# Patient Record
Sex: Female | Born: 1955 | Race: Black or African American | Hispanic: No | Smoking: Never smoker
Health system: Southern US, Community
[De-identification: ages and names within clinical notes are randomized; demographics above are authoritative.]

## PROBLEM LIST (undated history)

## (undated) DIAGNOSIS — E118 Type 2 diabetes mellitus with unspecified complications: Secondary | ICD-10-CM

---

## 2021-07-14 ENCOUNTER — Inpatient Hospital Stay (HOSPITAL_COMMUNITY)
Admission: EM | Admit: 2021-07-14 | Discharge: 2021-07-19 | DRG: 638 | Disposition: A | Discharge status: Home / Self Care | Payer: Medicare Other | Attending: Family Medicine | Admitting: Family Medicine

## 2021-07-14 ENCOUNTER — Emergency Department (HOSPITAL_COMMUNITY): Payer: Medicare Other

## 2021-07-14 ENCOUNTER — Other Ambulatory Visit: Payer: Self-pay

## 2021-07-14 ENCOUNTER — Encounter (HOSPITAL_COMMUNITY): Payer: Self-pay | Admitting: *Deleted

## 2021-07-14 DIAGNOSIS — Z91128 Patient's intentional underdosing of medication regimen for other reason: Secondary | ICD-10-CM

## 2021-07-14 DIAGNOSIS — E860 Dehydration: Secondary | ICD-10-CM | POA: Diagnosis present

## 2021-07-14 DIAGNOSIS — D72829 Elevated white blood cell count, unspecified: Secondary | ICD-10-CM | POA: Diagnosis present

## 2021-07-14 DIAGNOSIS — N179 Acute kidney failure, unspecified: Secondary | ICD-10-CM | POA: Diagnosis present

## 2021-07-14 DIAGNOSIS — E1110 Type 2 diabetes mellitus with ketoacidosis without coma: Principal | ICD-10-CM | POA: Diagnosis present

## 2021-07-14 DIAGNOSIS — Z20822 Contact with and (suspected) exposure to covid-19: Secondary | ICD-10-CM | POA: Diagnosis present

## 2021-07-14 DIAGNOSIS — E870 Hyperosmolality and hypernatremia: Secondary | ICD-10-CM | POA: Diagnosis present

## 2021-07-14 DIAGNOSIS — R739 Hyperglycemia, unspecified: Secondary | ICD-10-CM | POA: Diagnosis present

## 2021-07-14 DIAGNOSIS — Z794 Long term (current) use of insulin: Secondary | ICD-10-CM

## 2021-07-14 DIAGNOSIS — I129 Hypertensive chronic kidney disease with stage 1 through stage 4 chronic kidney disease, or unspecified chronic kidney disease: Secondary | ICD-10-CM | POA: Diagnosis present

## 2021-07-14 DIAGNOSIS — N1832 Chronic kidney disease, stage 3b: Secondary | ICD-10-CM | POA: Diagnosis present

## 2021-07-14 DIAGNOSIS — T383X6A Underdosing of insulin and oral hypoglycemic [antidiabetic] drugs, initial encounter: Secondary | ICD-10-CM | POA: Diagnosis present

## 2021-07-14 DIAGNOSIS — Z6841 Body Mass Index (BMI) 40.0 and over, adult: Secondary | ICD-10-CM

## 2021-07-14 DIAGNOSIS — E1165 Type 2 diabetes mellitus with hyperglycemia: Secondary | ICD-10-CM

## 2021-07-14 DIAGNOSIS — E1122 Type 2 diabetes mellitus with diabetic chronic kidney disease: Secondary | ICD-10-CM | POA: Diagnosis present

## 2021-07-14 DIAGNOSIS — E1169 Type 2 diabetes mellitus with other specified complication: Secondary | ICD-10-CM

## 2021-07-14 DIAGNOSIS — E8881 Metabolic syndrome: Secondary | ICD-10-CM | POA: Diagnosis present

## 2021-07-14 DIAGNOSIS — Y929 Unspecified place or not applicable: Secondary | ICD-10-CM

## 2021-07-14 DIAGNOSIS — E1100 Type 2 diabetes mellitus with hyperosmolarity without nonketotic hyperglycemic-hyperosmolar coma (NKHHC): Secondary | ICD-10-CM

## 2021-07-14 DIAGNOSIS — E785 Hyperlipidemia, unspecified: Secondary | ICD-10-CM | POA: Diagnosis present

## 2021-07-14 DIAGNOSIS — T465X6A Underdosing of other antihypertensive drugs, initial encounter: Secondary | ICD-10-CM | POA: Diagnosis present

## 2021-07-14 DIAGNOSIS — R630 Anorexia: Secondary | ICD-10-CM | POA: Diagnosis present

## 2021-07-14 DIAGNOSIS — E875 Hyperkalemia: Secondary | ICD-10-CM | POA: Diagnosis present

## 2021-07-14 DIAGNOSIS — Z79899 Other long term (current) drug therapy: Secondary | ICD-10-CM

## 2021-07-14 HISTORY — DX: Type 2 diabetes mellitus with unspecified complications: E11.8

## 2021-07-14 LAB — CBG MONITORING, ED
Glucose-Capillary: >600 mg/dL (ref 70–99)
Glucose-Capillary: >600 mg/dL (ref 70–99)
Glucose-Capillary: >600 mg/dL (ref 70–99)
Glucose-Capillary: >600 mg/dL (ref 70–99)

## 2021-07-14 LAB — BASIC METABOLIC PANEL
Anion gap: 26 — ABNORMAL HIGH (ref 5–15)
BUN: 46 mg/dL — ABNORMAL HIGH (ref 8–23)
CO2: 18 mmol/L — ABNORMAL LOW (ref 22–32)
Calcium: 10.7 mg/dL — ABNORMAL HIGH (ref 8.9–10.3)
Chloride: 101 mmol/L (ref 98–111)
Creatinine, Ser: 2.43 mg/dL — ABNORMAL HIGH (ref 0.44–1.00)
GFR, Estimated: 21 mL/min — ABNORMAL LOW (ref >60)
Glucose, Bld: 916 mg/dL (ref 70–99)
Potassium: 7 mmol/L (ref 3.5–5.1)
Sodium: 145 mmol/L (ref 135–145)

## 2021-07-14 LAB — CBC WITH DIFFERENTIAL/PLATELET
Abs Immature Granulocytes: 0.04 10*3/uL (ref 0.00–0.07)
Basophils Absolute: 0.1 10*3/uL (ref 0.0–0.1)
Basophils Relative: 0 %
Eosinophils Absolute: 0 10*3/uL (ref 0.0–0.5)
Eosinophils Relative: 0 %
HCT: 51.7 % — ABNORMAL HIGH (ref 36.0–46.0)
Hemoglobin: 14.6 g/dL (ref 12.0–15.0)
Immature Granulocytes: 0 %
Lymphocytes Relative: 9 %
Lymphs Abs: 1 10*3/uL (ref 0.7–4.0)
MCH: 26 pg (ref 26.0–34.0)
MCHC: 28.2 g/dL — ABNORMAL LOW (ref 30.0–36.0)
MCV: 92.2 fL (ref 80.0–100.0)
Monocytes Absolute: 0.6 10*3/uL (ref 0.1–1.0)
Monocytes Relative: 6 %
Neutro Abs: 9.5 10*3/uL — ABNORMAL HIGH (ref 1.7–7.7)
Neutrophils Relative %: 85 %
Platelets: 254 10*3/uL (ref 150–400)
RBC: 5.61 MIL/uL — ABNORMAL HIGH (ref 3.87–5.11)
RDW: 15.4 % (ref 11.5–15.5)
WBC: 11.2 10*3/uL — ABNORMAL HIGH (ref 4.0–10.5)
nRBC: 0 % (ref 0.0–0.2)

## 2021-07-14 MED ORDER — SODIUM CHLORIDE 0.9 % IV BOLUS: 1000.0000 mL | Freq: Once | INTRAVENOUS | Status: DC

## 2021-07-14 MED ORDER — LACTATED RINGERS IV SOLN: INTRAVENOUS | Status: DC

## 2021-07-14 MED ORDER — DEXTROSE 50 % IV SOLN: 0.0000 mL | INTRAVENOUS | Status: DC | PRN

## 2021-07-14 MED ORDER — CALCIUM GLUCONATE-NACL 1-0.675 GM/50ML-% IV SOLN
1.0000 g | Freq: Once | INTRAVENOUS | Status: AC
  Administered 2021-07-14: 1000 mg via INTRAVENOUS
  Filled 2021-07-14: qty 50

## 2021-07-14 MED ORDER — DEXTROSE IN LACTATED RINGERS 5 % IV SOLN: INTRAVENOUS | Status: DC

## 2021-07-14 MED ORDER — INSULIN REGULAR(HUMAN) IN NACL 100-0.9 UT/100ML-% IV SOLN
INTRAVENOUS | Status: DC
Start: 2021-07-14 — End: 2021-07-15
  Administered 2021-07-14: 11.5 [IU]/h via INTRAVENOUS
  Administered 2021-07-15: 12 [IU]/h via INTRAVENOUS
  Filled 2021-07-14 (×2): qty 100

## 2021-07-14 NOTE — ED Triage Notes (Signed)
The pt arrived  bt gems from   a relatives home  she is here visiting here form [place]  shes a diabetic that has not taken her meds for 4 days.  No reason why  she reports that she takes her meds with her when she travels  she does not know when she will return to [place] [address]

## 2021-07-14 NOTE — ED Notes (Signed)
Glucose >600, endotool instructs rate 11.5

## 2021-07-14 NOTE — ED Provider Notes (Signed)
[facility] EMERGENCY DEPARTMENT Provider Note   CSN: [id number] Arrival date & time: 07/14/21  2025     History Chief Complaint  Patient presents with   Hyperglycemia    Autumn James is a 65 y.o. female.  HPI Level 5 caveat due to altered mental status  Patient is able to provide some history but primarily history is provided by son.  He states that patient arrived from [place] where she lives to visit her son.  States that she has not taken her diabetes medications in 2 days--Per triage provider it has been 4 days--she does use insulin 30U Lantus daily.  On my reassessment 12:37 AM patient is more alert and able to answer questions  She states that she stopped taking her insulin 4 days ago.  She states that she does not know why she stopped taking her insulin.  On further questioning she states she does not like being poked with needles. She denies any chest pain or shortness of breath lightheadedness or dizziness nausea or vomiting.  She states she feels very fatigued and tired and cloudy and drained.  She has been peeing frequently over the past 3 to 4 days.  She denies any cough congestion fevers or chills.  No headaches no falls or injuries.  No other associate symptoms.     Past Medical History:  Diagnosis Date   Diabetic complication (HCC)     There are no problems to display for this patient.   History reviewed. No pertinent surgical history.   OB History   No obstetric history on file.     No family history on file.  Social History   Tobacco Use   Smoking status: Never    Passive exposure: Never   Smokeless tobacco: Never  Substance Use Topics   Alcohol use: Never   Drug use: Never    Home Medications Prior to Admission medications   Not on File    Allergies    Ivp dye [iodinated diagnostic agents]  Review of Systems   Review of Systems  Constitutional:  Positive for fatigue. Negative for chills and fever.  HENT:   Negative for congestion.   Eyes:  Negative for pain.  Respiratory:  Negative for cough and shortness of breath.   Cardiovascular:  Negative for chest pain and leg swelling.  Gastrointestinal:  Negative for abdominal pain and vomiting.  Genitourinary:  Positive for frequency. Negative for dysuria.  Musculoskeletal:  Negative for myalgias.  Skin:  Negative for rash.  Neurological:  Negative for dizziness and headaches.   Physical Exam Updated Vital Signs BP (!) 146/97   Pulse (!) 110   Temp 97.9 F (36.6 C) (Oral)   Resp 13   Ht 5\' 5"  (1.651 m)   Wt 116.1 kg   SpO2 95%   BMI 42.60 kg/m   Physical Exam Vitals and nursing note reviewed.  Constitutional:      General: She is not in acute distress.    Appearance: She is obese.  HENT:     Head: Normocephalic and atraumatic.     Nose: Nose normal.     Mouth/Throat:     Mouth: Mucous membranes are dry.  Eyes:     General: No scleral icterus. Cardiovascular:     Rate and Rhythm: Regular rhythm. Tachycardia present.     Pulses: Normal pulses.     Heart sounds: Normal heart sounds.  Pulmonary:     Effort: Pulmonary effort is normal. No respiratory distress.  Breath sounds: No wheezing.  Abdominal:     Palpations: Abdomen is soft.     Tenderness: There is no abdominal tenderness. There is no guarding or rebound.  Musculoskeletal:     Cervical back: Normal range of motion.     Right lower leg: No edema.     Left lower leg: No edema.     Comments: No lower extremity edema  Skin:    General: Skin is warm and dry.     Capillary Refill: Capillary refill takes less than 2 seconds.  Neurological:     Mental Status: She is alert.     Comments: Oriented x2, not certain of where she is currently located  Moves all 4 extremities has sensation all 4 extremities smile symmetric, EOMI  Psychiatric:     Comments: Flat affect    ED Results / Procedures / Treatments   Labs (all labs ordered are listed, but only abnormal results  are displayed) Labs Reviewed  BASIC METABOLIC PANEL - Abnormal; Notable for the following components:      Result Value   Potassium 7.0 (*)    CO2 18 (*)    Glucose, Bld 916 (*)    BUN 46 (*)    Creatinine, Ser 2.43 (*)    Calcium 10.7 (*)    GFR, Estimated 21 (*)    Anion gap 26 (*)    All other components within normal limits  CBC WITH DIFFERENTIAL/PLATELET - Abnormal; Notable for the following components:   WBC 11.2 (*)    RBC 5.61 (*)    HCT 51.7 (*)    MCHC 28.2 (*)    Neutro Abs 9.5 (*)    All other components within normal limits  CBG MONITORING, ED - Abnormal; Notable for the following components:   Glucose-Capillary >600 (*)    All other components within normal limits  CBG MONITORING, ED - Abnormal; Notable for the following components:   Glucose-Capillary >600 (*)    All other components within normal limits  CBG MONITORING, ED - Abnormal; Notable for the following components:   Glucose-Capillary >600 (*)    All other components within normal limits  CBG MONITORING, ED - Abnormal; Notable for the following components:   Glucose-Capillary >600 (*)    All other components within normal limits  RESP PANEL BY RT-PCR (FLU A&B, COVID) ARPGX2  URINALYSIS, ROUTINE W REFLEX MICROSCOPIC  BETA-HYDROXYBUTYRIC ACID  BASIC METABOLIC PANEL  BASIC METABOLIC PANEL  BASIC METABOLIC PANEL  BASIC METABOLIC PANEL  BETA-HYDROXYBUTYRIC ACID  BASIC METABOLIC PANEL  BETA-HYDROXYBUTYRIC ACID  HEPATIC FUNCTION PANEL  I-STAT VENOUS BLOOD GAS, ED    EKG None  Radiology DG Chest Portable 1 View  Result Date: 07/15/2021 CLINICAL DATA:  Shortness of breath EXAM: PORTABLE CHEST 1 VIEW COMPARISON:  None. FINDINGS: Heart and mediastinal contours are within normal limits. No focal opacities or effusions. No acute bony abnormality. IMPRESSION: No active disease. Electronically Signed   By: Charlett Nose M.D.   On: 07/15/2021 00:15    Procedures Ultrasound ED Peripheral IV  (Provider)  Date/Time: 07/14/2021 10:56 PM Performed by: Gailen Shelter, PA Authorized by: Gailen Shelter, PA   Procedure details:    Indications: multiple failed IV attempts     Skin Prep: chlorhexidine gluconate     Location:  Right AC   Angiocath:  20 G   Bedside Ultrasound Guided: Yes     Images: not archived     Patient tolerated procedure without complications: Yes  Dressing applied: Yes   .Critical Care Performed by: Gailen Shelter, PA Authorized by: Gailen Shelter, PA   Critical care provider statement:    Critical care time (minutes):  35   Critical care time was exclusive of:  Separately billable procedures and treating other patients and teaching time   Critical care was necessary to treat or prevent imminent or life-threatening deterioration of the following conditions:  Metabolic crisis   Critical care was time spent personally by me on the following activities:  Discussions with consultants, evaluation of patient's response to treatment, examination of patient, review of old charts, re-evaluation of patient's condition, pulse oximetry, ordering and review of radiographic studies, ordering and review of laboratory studies and ordering and performing treatments and interventions   I assumed direction of critical care for this patient from another provider in my specialty: no     Medications Ordered in ED Medications  insulin regular, human (MYXREDLIN) 100 units/ 100 mL infusion (11.5 Units/hr Intravenous New Bag/Given 07/14/21 2246)  lactated ringers infusion ( Intravenous New Bag/Given 07/14/21 2239)  dextrose 5 % in lactated ringers infusion (0 mLs Intravenous Hold 07/14/21 2204)  dextrose 50 % solution 0-50 mL (has no administration in time range)  sodium chloride 0.9 % bolus 1,000 mL (has no administration in time range)  albuterol (VENTOLIN HFA) 108 (90 Base) MCG/ACT inhaler 2 puff (has no administration in time range)  calcium gluconate 1 g/ 50 mL sodium  chloride IVPB (0 mg Intravenous Stopped 07/14/21 2354)    ED Course  I have reviewed the triage vital signs and the nursing notes.  Pertinent labs & imaging results that were available during my care of the patient were reviewed by me and considered in my medical decision making (see chart for details).  Clinical Course as of 07/15/21 0037  Mon Jul 15, 2021  0032 Discussed with Dr. Laroy Apple of family medicine resident service who will evaluate pt.  Dr Pilar Plate aware of pt in case of need for further resuscitation. She is well appearing .Mentating well at this time.  [WF]  0033 Potassium(!!): 7.0 Treated with albuterol, insulin, calcium gluconate no EKG changes. [WF]  0033 Glucose(!!): 916 Hyperglycemia with anion gap of 26 consistent with DKA VBG and beta hydroxybutyrate ordered [WF]  0033 CBC with Differential (PNL)(!) Mild leukocytosis but also erythrocytosis consistent with dehydration [WF]  0033 She is afebrile doubt sepsis. [WF]    Clinical Course User Index [WF] Gailen Shelter, [place]   MDM Rules/Calculators/A&P                           Patient is a 65 year old female with past medical history significant for DM2 takes Lantus nightly 30 units however stopped taking this 4 days ago she is informing me after her mental status is improved that she stopped taking her insulin because she was tired of needles.  She denies any pain anywhere currently states that she is breathing well denies any chest pain slurred speech confusion but does endorse severe weakness and fatigue with polyuria  She is neurologically intact on my examination nontender abdomen appears dehydrated  Does have a potassium of greater than 7 Provided patient with calcium gluconate, insulin and albuterol 2 puffs  EKG without any changes of hyperkalemia.  BMP will be repeated to confirm hyperkalemia.  She will require admission.  Discussed with Dr. Pilar Plate as well as family medicine teaching service.  Teaching service  will evaluate and  consider admission and Dr. Pilar Plate aware of pt in case of additional questions/needs.   Final Clinical Impression(s) / ED Diagnoses Final diagnoses:  Diabetic ketoacidosis without coma associated with type 2 diabetes mellitus [facility])    Rx / DC Orders ED Discharge Orders     None        Gailen Shelter, [place] 07/15/21 0038    Benjiman Core, MD 07/17/21 [phone]

## 2021-07-14 NOTE — ED Notes (Signed)
Lab called glucose 916  pot 7.0  chg notified working on bed

## 2021-07-14 NOTE — ED Notes (Signed)
Endotool instructs insulin rate 11.5, glucose recheck 2314

## 2021-07-14 NOTE — ED Provider Notes (Signed)
Emergency Medicine Provider Triage Evaluation Note  Autumn James , a 65 y.o. female  was evaluated in triage.  Pt brought in by EMS for hyperglycemia.  Patient is visiting from [place], states that she has not taken her diabetes medication for 4 days, unknown reasoning.  She is not sure if she has type I or type 2 diabetes.  States she is on insulin.  CBG reads high.  Denies pain or other symptoms.  Review of Systems  Positive: Lethargy Negative: Nausea, vomiting, diarrhea, fevers  Physical Exam  BP (!) 159/96 (BP Location: Left Arm)   Pulse 98   Temp 97.9 F (36.6 C) (Oral)   Resp 15   Ht 5\' 5"  (1.651 m)   Wt 116.1 kg   SpO2 95%   BMI 42.60 kg/m  Gen:   Lethargic, no distress   Resp:  Normal effort  MSK:   Moves extremities without difficulty  Other:  No Kussmaul breathing, capnography normal with EMS  Medical Decision Making  Medically screening exam initiated at 8:42 PM.  Appropriate orders placed.  Autumn James was informed that the remainder of the evaluation will be completed by another provider, this initial triage assessment does not replace that evaluation, and the importance of remaining in the ED until their evaluation is complete.  Hyperglycemia protocol initiated   Autumn James 07/14/21 2046    2047, MD 07/22/21 1322

## 2021-07-15 DIAGNOSIS — R739 Hyperglycemia, unspecified: Secondary | ICD-10-CM

## 2021-07-15 DIAGNOSIS — T383X6A Underdosing of insulin and oral hypoglycemic [antidiabetic] drugs, initial encounter: Secondary | ICD-10-CM | POA: Diagnosis present

## 2021-07-15 DIAGNOSIS — Z79899 Other long term (current) drug therapy: Secondary | ICD-10-CM | POA: Diagnosis not present

## 2021-07-15 DIAGNOSIS — E1110 Type 2 diabetes mellitus with ketoacidosis without coma: Secondary | ICD-10-CM | POA: Insufficient documentation

## 2021-07-15 DIAGNOSIS — E8881 Metabolic syndrome: Secondary | ICD-10-CM | POA: Diagnosis present

## 2021-07-15 DIAGNOSIS — E1165 Type 2 diabetes mellitus with hyperglycemia: Secondary | ICD-10-CM

## 2021-07-15 DIAGNOSIS — T465X6A Underdosing of other antihypertensive drugs, initial encounter: Secondary | ICD-10-CM | POA: Diagnosis present

## 2021-07-15 DIAGNOSIS — Z6841 Body Mass Index (BMI) 40.0 and over, adult: Secondary | ICD-10-CM | POA: Diagnosis not present

## 2021-07-15 DIAGNOSIS — I129 Hypertensive chronic kidney disease with stage 1 through stage 4 chronic kidney disease, or unspecified chronic kidney disease: Secondary | ICD-10-CM | POA: Diagnosis present

## 2021-07-15 DIAGNOSIS — Y929 Unspecified place or not applicable: Secondary | ICD-10-CM | POA: Diagnosis not present

## 2021-07-15 DIAGNOSIS — E1169 Type 2 diabetes mellitus with other specified complication: Secondary | ICD-10-CM | POA: Diagnosis not present

## 2021-07-15 DIAGNOSIS — Z91128 Patient's intentional underdosing of medication regimen for other reason: Secondary | ICD-10-CM | POA: Diagnosis not present

## 2021-07-15 DIAGNOSIS — E1122 Type 2 diabetes mellitus with diabetic chronic kidney disease: Secondary | ICD-10-CM | POA: Diagnosis present

## 2021-07-15 DIAGNOSIS — E870 Hyperosmolality and hypernatremia: Secondary | ICD-10-CM | POA: Diagnosis present

## 2021-07-15 DIAGNOSIS — E1100 Type 2 diabetes mellitus with hyperosmolarity without nonketotic hyperglycemic-hyperosmolar coma (NKHHC): Secondary | ICD-10-CM | POA: Diagnosis not present

## 2021-07-15 DIAGNOSIS — R630 Anorexia: Secondary | ICD-10-CM | POA: Diagnosis present

## 2021-07-15 DIAGNOSIS — N179 Acute kidney failure, unspecified: Secondary | ICD-10-CM | POA: Diagnosis present

## 2021-07-15 DIAGNOSIS — E875 Hyperkalemia: Secondary | ICD-10-CM | POA: Diagnosis present

## 2021-07-15 DIAGNOSIS — N1832 Chronic kidney disease, stage 3b: Secondary | ICD-10-CM | POA: Diagnosis present

## 2021-07-15 DIAGNOSIS — Z20822 Contact with and (suspected) exposure to covid-19: Secondary | ICD-10-CM | POA: Diagnosis present

## 2021-07-15 DIAGNOSIS — D72829 Elevated white blood cell count, unspecified: Secondary | ICD-10-CM | POA: Diagnosis present

## 2021-07-15 DIAGNOSIS — E860 Dehydration: Secondary | ICD-10-CM | POA: Diagnosis present

## 2021-07-15 DIAGNOSIS — Z794 Long term (current) use of insulin: Secondary | ICD-10-CM | POA: Diagnosis not present

## 2021-07-15 DIAGNOSIS — E785 Hyperlipidemia, unspecified: Secondary | ICD-10-CM | POA: Diagnosis present

## 2021-07-15 LAB — GLUCOSE, CAPILLARY
Glucose-Capillary: 209 mg/dL — ABNORMAL HIGH (ref 70–99)
Glucose-Capillary: 283 mg/dL — ABNORMAL HIGH (ref 70–99)
Glucose-Capillary: 288 mg/dL — ABNORMAL HIGH (ref 70–99)
Glucose-Capillary: 361 mg/dL — ABNORMAL HIGH (ref 70–99)

## 2021-07-15 LAB — BASIC METABOLIC PANEL
Anion gap: 15 (ref 5–15)
Anion gap: 13 (ref 5–15)
Anion gap: 11 (ref 5–15)
Anion gap: 10 (ref 5–15)
Anion gap: 14 (ref 5–15)
Anion gap: 13 (ref 5–15)
BUN: 49 mg/dL — ABNORMAL HIGH (ref 8–23)
BUN: 50 mg/dL — ABNORMAL HIGH (ref 8–23)
BUN: 50 mg/dL — ABNORMAL HIGH (ref 8–23)
BUN: 47 mg/dL — ABNORMAL HIGH (ref 8–23)
BUN: 50 mg/dL — ABNORMAL HIGH (ref 8–23)
BUN: 49 mg/dL — ABNORMAL HIGH (ref 8–23)
CO2: 23 mmol/L (ref 22–32)
CO2: 28 mmol/L (ref 22–32)
CO2: 27 mmol/L (ref 22–32)
CO2: 28 mmol/L (ref 22–32)
CO2: 23 mmol/L (ref 22–32)
CO2: 23 mmol/L (ref 22–32)
Calcium: 10.6 mg/dL — ABNORMAL HIGH (ref 8.9–10.3)
Calcium: 10.7 mg/dL — ABNORMAL HIGH (ref 8.9–10.3)
Calcium: 10.3 mg/dL (ref 8.9–10.3)
Calcium: 10.2 mg/dL (ref 8.9–10.3)
Calcium: 9.9 mg/dL (ref 8.9–10.3)
Calcium: 10.2 mg/dL (ref 8.9–10.3)
Chloride: 111 mmol/L (ref 98–111)
Chloride: 113 mmol/L — ABNORMAL HIGH (ref 98–111)
Chloride: 115 mmol/L — ABNORMAL HIGH (ref 98–111)
Chloride: 113 mmol/L — ABNORMAL HIGH (ref 98–111)
Chloride: 113 mmol/L — ABNORMAL HIGH (ref 98–111)
Chloride: 113 mmol/L — ABNORMAL HIGH (ref 98–111)
Creatinine, Ser: 2.56 mg/dL — ABNORMAL HIGH (ref 0.44–1.00)
Creatinine, Ser: 2.56 mg/dL — ABNORMAL HIGH (ref 0.44–1.00)
Creatinine, Ser: 2.35 mg/dL — ABNORMAL HIGH (ref 0.44–1.00)
Creatinine, Ser: 2.38 mg/dL — ABNORMAL HIGH (ref 0.44–1.00)
Creatinine, Ser: 2.3 mg/dL — ABNORMAL HIGH (ref 0.44–1.00)
Creatinine, Ser: 2.22 mg/dL — ABNORMAL HIGH (ref 0.44–1.00)
GFR, Estimated: 20 mL/min — ABNORMAL LOW (ref >60)
GFR, Estimated: 20 mL/min — ABNORMAL LOW (ref >60)
GFR, Estimated: 22 mL/min — ABNORMAL LOW (ref >60)
GFR, Estimated: 22 mL/min — ABNORMAL LOW (ref >60)
GFR, Estimated: 23 mL/min — ABNORMAL LOW (ref >60)
GFR, Estimated: 24 mL/min — ABNORMAL LOW (ref >60)
Glucose, Bld: 573 mg/dL (ref 70–99)
Glucose, Bld: 389 mg/dL — ABNORMAL HIGH (ref 70–99)
Glucose, Bld: 231 mg/dL — ABNORMAL HIGH (ref 70–99)
Glucose, Bld: 214 mg/dL — ABNORMAL HIGH (ref 70–99)
Glucose, Bld: 272 mg/dL — ABNORMAL HIGH (ref 70–99)
Glucose, Bld: 294 mg/dL — ABNORMAL HIGH (ref 70–99)
Potassium: 4 mmol/L (ref 3.5–5.1)
Potassium: 3.9 mmol/L (ref 3.5–5.1)
Potassium: 3.7 mmol/L (ref 3.5–5.1)
Potassium: 4.2 mmol/L (ref 3.5–5.1)
Potassium: 4.2 mmol/L (ref 3.5–5.1)
Potassium: 4.4 mmol/L (ref 3.5–5.1)
Sodium: 149 mmol/L — ABNORMAL HIGH (ref 135–145)
Sodium: 154 mmol/L — ABNORMAL HIGH (ref 135–145)
Sodium: 153 mmol/L — ABNORMAL HIGH (ref 135–145)
Sodium: 151 mmol/L — ABNORMAL HIGH (ref 135–145)
Sodium: 150 mmol/L — ABNORMAL HIGH (ref 135–145)
Sodium: 149 mmol/L — ABNORMAL HIGH (ref 135–145)

## 2021-07-15 LAB — I-STAT VENOUS BLOOD GAS, ED
Acid-base deficit: 4 mmol/L — ABNORMAL HIGH (ref 0.0–2.0)
Bicarbonate: 18.8 mmol/L — ABNORMAL LOW (ref 20.0–28.0)
Calcium, Ion: 1.27 mmol/L (ref 1.15–1.40)
HCT: 44 % (ref 36.0–46.0)
Hemoglobin: 15 g/dL (ref 12.0–15.0)
O2 Saturation: 97 %
Potassium: 4.5 mmol/L (ref 3.5–5.1)
Sodium: 148 mmol/L — ABNORMAL HIGH (ref 135–145)
TCO2: 20 mmol/L — ABNORMAL LOW (ref 22–32)
pCO2, Ven: 29.3 mmHg — ABNORMAL LOW (ref 44.0–60.0)
pH, Ven: 7.414 (ref 7.250–7.430)
pO2, Ven: 90 mmHg — ABNORMAL HIGH (ref 32.0–45.0)

## 2021-07-15 LAB — URINALYSIS, ROUTINE W REFLEX MICROSCOPIC
Bacteria, UA: NONE SEEN
Bilirubin Urine: NEGATIVE
Glucose, UA: >=500 mg/dL — AB
Hgb urine dipstick: NEGATIVE
Ketones, ur: 20 mg/dL — AB
Nitrite: NEGATIVE
Protein, ur: 30 mg/dL — AB
Specific Gravity, Urine: 1.025 (ref 1.005–1.030)
pH: 5 (ref 5.0–8.0)

## 2021-07-15 LAB — HEPATIC FUNCTION PANEL
ALT: 14 U/L (ref 0–44)
AST: 12 U/L — ABNORMAL LOW (ref 15–41)
Albumin: 3.4 g/dL — ABNORMAL LOW (ref 3.5–5.0)
Alkaline Phosphatase: 79 U/L (ref 38–126)
Bilirubin, Direct: <0.1 mg/dL (ref 0.0–0.2)
Total Bilirubin: 1.1 mg/dL (ref 0.3–1.2)
Total Protein: 7.7 g/dL (ref 6.5–8.1)

## 2021-07-15 LAB — CBG MONITORING, ED
Glucose-Capillary: 187 mg/dL — ABNORMAL HIGH (ref 70–99)
Glucose-Capillary: 196 mg/dL — ABNORMAL HIGH (ref 70–99)
Glucose-Capillary: 198 mg/dL — ABNORMAL HIGH (ref 70–99)
Glucose-Capillary: 203 mg/dL — ABNORMAL HIGH (ref 70–99)
Glucose-Capillary: 205 mg/dL — ABNORMAL HIGH (ref 70–99)
Glucose-Capillary: 223 mg/dL — ABNORMAL HIGH (ref 70–99)
Glucose-Capillary: 268 mg/dL — ABNORMAL HIGH (ref 70–99)
Glucose-Capillary: 277 mg/dL — ABNORMAL HIGH (ref 70–99)
Glucose-Capillary: 337 mg/dL — ABNORMAL HIGH (ref 70–99)
Glucose-Capillary: 393 mg/dL — ABNORMAL HIGH (ref 70–99)
Glucose-Capillary: 417 mg/dL — ABNORMAL HIGH (ref 70–99)
Glucose-Capillary: 471 mg/dL — ABNORMAL HIGH (ref 70–99)
Glucose-Capillary: 514 mg/dL (ref 70–99)
Glucose-Capillary: >600 mg/dL (ref 70–99)
Glucose-Capillary: >600 mg/dL (ref 70–99)

## 2021-07-15 LAB — RESP PANEL BY RT-PCR (FLU A&B, COVID) ARPGX2
Influenza A by PCR: NEGATIVE
Influenza B by PCR: NEGATIVE
SARS Coronavirus 2 by RT PCR: NEGATIVE

## 2021-07-15 LAB — BETA-HYDROXYBUTYRIC ACID
Beta-Hydroxybutyric Acid: >8 mmol/L — ABNORMAL HIGH (ref 0.05–0.27)
Beta-Hydroxybutyric Acid: 0.94 mmol/L — ABNORMAL HIGH (ref 0.05–0.27)

## 2021-07-15 LAB — HIV ANTIBODY (ROUTINE TESTING W REFLEX): HIV Screen 4th Generation wRfx: NONREACTIVE

## 2021-07-15 LAB — HEMOGLOBIN A1C
Hgb A1c MFr Bld: 14.3 % — ABNORMAL HIGH (ref 4.8–5.6)
Mean Plasma Glucose: 363.71 mg/dL

## 2021-07-15 LAB — MRSA NEXT GEN BY PCR, NASAL: MRSA by PCR Next Gen: NOT DETECTED

## 2021-07-15 MED ORDER — SODIUM CHLORIDE 0.45 % IV SOLN: INTRAVENOUS | Status: DC

## 2021-07-15 MED ORDER — DEXTROSE-NACL 5-0.45 % IV SOLN: INTRAVENOUS | Status: DC

## 2021-07-15 MED ORDER — INSULIN GLARGINE-YFGN 100 UNIT/ML ~~LOC~~ SOLN
30.0000 [IU] | Freq: Every day | SUBCUTANEOUS | Status: DC
  Administered 2021-07-15: 30 [IU] via SUBCUTANEOUS
  Filled 2021-07-15: qty 0.3

## 2021-07-15 MED ORDER — LACTATED RINGERS IV BOLUS: 1000.0000 mL | Freq: Once | INTRAVENOUS | Status: DC

## 2021-07-15 MED ORDER — ALBUTEROL SULFATE HFA 108 (90 BASE) MCG/ACT IN AERS
2.0000 | INHALATION_SPRAY | Freq: Once | RESPIRATORY_TRACT | Status: AC
  Administered 2021-07-15: 2 via RESPIRATORY_TRACT
  Filled 2021-07-15: qty 6.7

## 2021-07-15 MED ORDER — INSULIN REGULAR(HUMAN) IN NACL 100-0.9 UT/100ML-% IV SOLN
INTRAVENOUS | Status: DC
  Administered 2021-07-15: 11.5 [IU]/h via INTRAVENOUS
  Filled 2021-07-15: qty 100

## 2021-07-15 MED ORDER — DEXTROSE 50 % IV SOLN: 0.0000 mL | INTRAVENOUS | Status: DC | PRN

## 2021-07-15 MED ORDER — ENOXAPARIN SODIUM 30 MG/0.3ML IJ SOSY
30.0000 mg | PREFILLED_SYRINGE | INTRAMUSCULAR | Status: DC
  Administered 2021-07-15 – 2021-07-16 (×2): 30 mg via SUBCUTANEOUS
  Filled 2021-07-15 (×2): qty 0.3

## 2021-07-15 MED ORDER — POTASSIUM CHLORIDE CRYS ER 20 MEQ PO TBCR
40.0000 meq | EXTENDED_RELEASE_TABLET | Freq: Once | ORAL | Status: AC
  Administered 2021-07-15: 40 meq via ORAL
  Filled 2021-07-15: qty 2

## 2021-07-15 MED ORDER — SODIUM CHLORIDE 0.9 % IV BOLUS
1000.0000 mL | Freq: Once | INTRAVENOUS | Status: AC
  Administered 2021-07-15: 1000 mL via INTRAVENOUS

## 2021-07-15 MED ORDER — INSULIN ASPART 100 UNIT/ML IJ SOLN
0.0000 [IU] | Freq: Three times a day (TID) | INTRAMUSCULAR | Status: DC
  Administered 2021-07-15: 5 [IU] via SUBCUTANEOUS

## 2021-07-15 MED ORDER — POTASSIUM CHLORIDE 10 MEQ/100ML IV SOLN
10.0000 meq | INTRAVENOUS | Status: AC
Start: 2021-07-15 — End: 2021-07-16
  Administered 2021-07-15 (×2): 10 meq via INTRAVENOUS
  Filled 2021-07-15 (×2): qty 100

## 2021-07-15 MED ORDER — SODIUM CHLORIDE 0.45 % IV SOLN: INTRAVENOUS | Status: AC

## 2021-07-15 NOTE — Progress Notes (Signed)
Inpatient Diabetes Program Recommendations  AACE/ADA: New Consensus Statement on Inpatient Glycemic Control (2015)  Target Ranges:  Prepandial:   less than 140 mg/dL      Peak postprandial:   less than 180 mg/dL (1-2 hours)      Critically ill patients:  140 - 180 mg/dL   Lab Results  Component Value Date   GLUCAP 196 (H) 07/15/2021   HGBA1C 14.3 (H) 07/15/2021    Review of Glycemic Control Results for LAQUIDA, COTRELL (MRN [id number]) as of 07/15/2021 10:21  Ref. Range 07/15/2021 07:59 07/15/2021 09:05 07/15/2021 10:02  Glucose-Capillary Latest Ref Range: 70 - 99 mg/dL 106 (H) 269 (H) 485 (H)    Outpatient Diabetes medications: Lantus 30 units QD Current orders for Inpatient glycemic control: IV insulin  Inpatient Diabetes Program Recommendations:    When ready to transition consider: -Semglee 18 units two hours prior to discontinuation, then QD to follow -Novolog 0-9 units TID & HS  Thanks, Lujean Rave, MSN, RNC-OB Diabetes Coordinator [phone] (8a-5p)

## 2021-07-15 NOTE — Hospital Course (Addendum)
Hyperglycemia in the context of T2DM, likely DKA Patient reportedly traveled from [place] to visit her son in [place]. Patient reports not taking her insulin in 4 days.  Home regimen of 30 units of Lantus daily. A1C of 14.3. On admission patient somnolent with orthostasis and polyuria.  Lab values notable for glc 916, anion gap of 26 (normal pH), beta hydroxybutyrate greater than 8, UA with 20 ketones. No signs of infection aside from mild leukocytosis to 11.2 (CXR negative, no fever). She was started on an insulin drip. Her anion gap closed and this was discontinued. Patient had limited appetite, only consuming 25-50% of meals, but was found to be very insulin resistant. Patient reports that she cannot inject herself and is not willing to learn how. We discussed this with her family with her permission and her granddaughter is in nursing school and can giver her once daily injections (she is not available for prandial injections). Patient was found to have a daily insulin requirement of about 100U with consuming a 25-50% of a regular diet. She was discharged on 70 units of glargine daily, but may require more. Plan for discharge was discussed extensively with family. As patient is from out of state, we were unable to set up home health. We recommend she follow up with her PCP ASAP to further titrate insulin needs and to obtain home health nursing to administer more insulin.    AKI- resolved Patient is a poor historian and is unsure if she has pre-existing renal disease. Per care wheverywhere, last Cr in 2019 of 1.05. Creatinine of 2.43 in the ED with a BUN of 46.  Creatinine of 2.56 on 9/26 with BUN of 50. For this AKI, she was given mIVF and her home losartan was held. Her creatinine improved to 1.11 on the day of discharge, AKI resolved. Her home Losartan was started back at 25 mg for renal protection.   Hypertension Patient had BP to the 140's to 170's systolic and so her home Coreg was restarted. Her blood  pressure improved to 120-140's systolic. She was discharged on her home dose of Coreg and the reduced dose of losartan.    HLD Continued home atorvastatin 40 mg.   Follow up recommendations: Consider goal directed medical therapies such as SGLT2i, GLP-1. Consider further increasing daily glargine with administration in two sites qday (will be necessary with doses above 70U).

## 2021-07-15 NOTE — H&P (Addendum)
Family Medicine Teaching [facility] Admission History and Physical Service Pager: [phone]  Patient name: Autumn James Medical record number: [id number] Date of birth: 1956/04/21 Age: 65 y.o. Gender: female  Primary Care Provider: No primary care provider on file. Consultants: None Code Status: FULL  Preferred Emergency Contact: Son [phone]  Chief Complaint: Fatigue  Assessment and Plan: Autumn James is a 65 y.o. female presenting with fatigue. PMH is significant for T2DM hypertension, hyperlipidemia.  DKA vs HHS in T2DM  Fatigue Patient states she is traveling from [place] and has not taken her insulin in 4 days. She says she uses 30 units of lantus daily. She says she does not like being poked by needles and this is why she stopped taking it. Patient was very drowsy on exam. She denies any fevers, nausea or vomiting. She has had polyuria and dizziness when standing. She says she does feel some shortness of breath. In ED vitals were tachycardia to low 100s and elevated BP to 140s-150s/90s saturating well on room air. Respirations were mostly around 16 with one RR of 26 which subsequently. She received 1 L NS bolus, had glucose of 916 with anion gap of 26. WBC mildly elevated to 11.2. VBG showed normal pH 7.414. WBC 11.2. B-OH butyrate >8. UA showed >500 glucose, 20 ketones and 30 urine protein with a small amount off leukocytes. CXR was negative for any acute findings. EKGs showed sinus tachycardia but no acute ST elevations. On exam patient was lethargic but alert and oriented to person, knew she was in the hospital but unsure of what state, and says she was here because of her "blood pressures." Otherwise benign. Ddx includes likely DKA as patient had glucose of 916 and anion gap of 26, however pH was normal at 7.4 and betahydroxybutyric acid >8. HHS possible given high glucose, and fatigued with normal pH however there is betahydroxybutyric acid >8. -Admit to FPTS,  attending Dr. Lum Babe -Vitals per protocol -DVT prophylaxis lovenox 30 mg -Diet NPO -IV Insulin, EndoTool low 140 high 180 -LR IVF currently, change to D5-LR when CBG <250 -notify if K>5 -cardiac monitoring -pulse oximetry continuous -PT/OT to evaluate -D50 IV prn for low blood sugars -Labs: BMP q4h, UA, beta hydroxybutyric acid q8h, hepatic function panel  Hyperkalemia Denies any chest pain or palpitations. Potassium is 7.0 in ED. EKG showed no acute changes. Calcium gluconate, insulin, and albuterol was given in the ED. Repeat K was 4.5.   -monitor on BMPs -notify if K>5  AKI Patient is unsure if she has seen a nephrologist before. She says she has been told her kidney levels have been elevated before. Creatinine of 2.43 in ED and BUN of 46.  -monitor on BMPs -mIVF -avoid nephrotoxic drugs  Hypertension BP on admission 146/97. Patient has home medication bottles in room which include hydralazine, carvedilol, and losartan.   -Monitor BP and restart home medications when able -Hold losartan for the above reason.   Hyperlipidemia Patient has home medication bottle of atorvastatin. -restart when dosage verified  FEN/GI: NPO Prophylaxis: lovenox 30 mg  Disposition: Progressive  History of Present Illness:  Autumn Lasecki is a 65 y.o. female presenting with fatigue.  She is visiting her son from [place]. She stopped taking her insulin 4 days ago because she say she did not like the needles or poking herself. She is experiencing dizziness, chronic vision changes, fatigue and loss of balance. Denies any recent falls. Endorses polyuria. History limited due to patient intermittently falling back to  sleep during exam.  Review Of Systems: Per HPI with the following additions:   Review of Systems  Eyes:  Positive for visual disturbance.  Respiratory:  Positive for shortness of breath.   Cardiovascular:  Negative for chest pain and palpitations.  Gastrointestinal:  Negative for  abdominal pain, constipation and diarrhea.  Endocrine: Positive for polyuria.  Neurological:  Positive for dizziness. Negative for headaches.    Patient Active Problem List   Diagnosis Date Noted   Hyperglycemia 07/15/2021    Past Medical History: Past Medical History:  Diagnosis Date   Diabetic complication [facility])     Past Surgical History: History reviewed. No pertinent surgical history.  Social History: Social History   Tobacco Use   Smoking status: Never    Passive exposure: Never   Smokeless tobacco: Never  Substance Use Topics   Alcohol use: Never   Drug use: Never   Additional social history:   Please also refer to relevant sections of EMR.  Family History: No family history on file.   Allergies and Medications: Allergies  Allergen Reactions   Ivp Dye [Iodinated Diagnostic Agents]    No current facility-administered medications on file prior to encounter.   No current outpatient medications on file prior to encounter.    Objective: BP (!) 146/97   Pulse (!) 110   Temp 97.9 F (36.6 C) (Oral)   Resp 13   Ht 5\' 5"  (1.651 m)   Wt 116.1 kg   SpO2 95%   BMI 42.60 kg/m  Exam: General: Sedated appearing, responsive to voice but dazed Eyes: no scleral injection Head: Normocephalic, atraumatic Cardiovascular: RRR no m/r/g Respiratory: CTAB, no iWOB, chest rises symmetrically Gastrointestinal: Abdomen soft, BS+, nontender MSK: Good tone, 1+ LE edema bilaterally near  Derm: No rashes visualized Neuro: oriented to person, place (hospital but doesn't know state), believes she is here for her blood pressures Psych: Sedated  Labs and Imaging: CBC BMET  Recent Labs  Lab 07/14/21 2050 07/15/21 0031  WBC 11.2*  --   HGB 14.6 15.0  HCT 51.7* 44.0  PLT 254  --    Recent Labs  Lab 07/14/21 2050 07/15/21 0031  NA 145 148*  K 7.0* 4.5  CL 101  --   CO2 18*  --   BUN 46*  --   CREATININE 2.43*  --   GLUCOSE 916*  --   CALCIUM 10.7*  --       EKG: My own interpretation (not copied from electronic read) sinus tachycardia, no acute ST elevation   DG Chest Portable 1 View  Result Date: 07/15/2021 CLINICAL DATA:  Shortness of breath EXAM: PORTABLE CHEST 1 VIEW COMPARISON:  None. FINDINGS: Heart and mediastinal contours are within normal limits. No focal opacities or effusions. No acute bony abnormality. IMPRESSION: No active disease. Electronically Signed   By: 07/17/2021 M.D.   On: 07/15/2021 00:15     07/17/2021, MD 07/15/2021, 2:33 AM PGY-1, [facility] FPTS Intern pager: [phone], text pages welcome   FPTS Upper-Level Resident Addendum   I have independently interviewed and examined the patient. I have discussed the above with the original author and agree with their documentation. My edits for correction/addition/clarification are in the document. Please see also any attending notes.   [phone], DO PGY-2, [facility] 07/15/2021 3:34 AM  FPTS Service pager: [phone] (text pages welcome through [facility])

## 2021-07-15 NOTE — ED Notes (Signed)
CBG 337, Endotool instructed 12, recheck [phone]

## 2021-07-15 NOTE — ED Notes (Signed)
Macario Golds brother [phone] requesting an update on the patient

## 2021-07-15 NOTE — ED Notes (Signed)
Endotool instructs 11.5, recheck at 435

## 2021-07-15 NOTE — ED Notes (Signed)
Endotool instructs 11.5, recheck at 531

## 2021-07-15 NOTE — Progress Notes (Signed)
FPTS Brief Progress Note  S: Lying in bed resting. Would like something to drink.    O: BP (!) 155/83 (BP Location: Left Arm)   Pulse 96   Temp 98 F (36.7 C) (Oral)   Resp 14   Ht 5\' 5"  (1.651 m)   Wt 116.1 kg   SpO2 98%   BMI 42.60 kg/m   General: Appears well, no acute distress. Age appropriate. Respiratory: normal effort Neuro: alert and oriented x4 Psych: normal affect   A/P: Hyperglycemia in the context of T2DM, likely DKA - Orders reviewed. Labs for AM ordered, which was adjusted as needed.  - Transition to D51/2NS, give 15 Units of semglee, let eat, turn off endo tool in 2 hours.   , DO 07/15/2021, 10:57 PM PGY-3, [facility] Night Resident  Please page [phone] with questions.

## 2021-07-15 NOTE — ED Notes (Signed)
Glucose >600. Endotool instructed 11.5 rate

## 2021-07-15 NOTE — ED Notes (Signed)
CBG 393, Endotool instructed rate change of 12

## 2021-07-15 NOTE — Progress Notes (Signed)
Family Medicine Teaching Service Daily Progress Note Intern Pager: [phone]  Patient name: Autumn James Medical record number: [id number] Date of birth: Dec 28, 1955 Age: 65 y.o. Gender: female  Primary Care Provider: No primary care provider on file. Consultants: none Code Status: FULL  Pt Overview and Major Events to Date:  9/26: admitted with glc of 916, endo tool started  Assessment and Plan: The patient is a 65 year old female presenting with hyperglycemia and fatigue.  Past medical history significant for T2DM, hypertension, hyperlipidemia.  Hyperglycemia in the context of T2DM, likely DKA Patient reports not taking her insulin in 4 days.  Home regimen of 30 units of Lantus daily. A1C of 14.3. On admission patient somnolent with orthostasis and polyuria.  Lab values notable for glc 916, anion gap of 26 (normal pH), beta hydroxybutyrate greater than 8, UA with 20 ketones. No signs of infection aside from mild leukocytosis to 11.2 (CXR negative, no fever). Most recent labs as of 7 AM BMP are glc of 389, AG of 13, K of 3.9. Notably Na of 154, Cl of 113. This morning she is more alert and oriented, however, still somewhat somnolent. Most recent CBG of 203.  - Endo tool until gap closes -- D5 with 1/2NS given CBG less than 250, has been started -- Kcl 40 meq oral now  -- Switch from LR to 1/2/NS at 125 mL/hr given hypernatremia and hyperchloridemia - BMPs every 4 hours - D/C Beta hydroxybutyrate every 8 hours - D50 IV for hypoglycemia - N.p.o. until off insulin drip - PT/OT -- Bedside swallow study  AKI on CKD (?) Patient is a poor historian and is unsure if she has pre-existing renal disease.  Creatinine of 2.43 in the ED with a BUN of 46.  Creatinine of 2.56 this morning with BUN of 50. Per care wheverywhere, last Cr in 2019 of 1.05.  - BMPs every 4 hours - IV fluids as above - Hold home losartan - Avoid nephrotoxins  Hypertension BP acceptable currently. Patient admits  non-compliance with home regimen. Will consider restarting if patient has persistent HTN.  -Continue to hold home meds  HLD -Continue home atorvastatin pending med rec  FEN/GI: NPO until off drip. Will give diet later if patient passes bedside swallow. PPx: Lovenox Dispo: likely home with care of son   Subjective:  On assessment this morning patient is lethargic but fully alert and oriented. She is unsure of any Hx of kidney disease. Sheadmits to not taking her BP meds for several weeks but is non-comittal about when she last took her insulin. Review of systems is negative for CP, N/V. Notable for stable, chronic SoB.   Objective: Temp:  [97.9 F (36.6 C)] 97.9 F (36.6 C) (09/25 2031) Pulse Rate:  [98-111] 100 (09/26 0600) Resp:  [10-26] 18 (09/26 0600) BP: (111-159)/(68-97) 138/73 (09/26 0600) SpO2:  [94 %-98 %] 94 % (09/26 0600) Weight:  [256 lb (116.1 kg)] 256 lb (116.1 kg) (09/25 2041) Physical Exam Vitals reviewed.  Cardiovascular:     Rate and Rhythm: Normal rate and regular rhythm.     Pulses: Normal pulses.     Heart sounds: No murmur heard. Pulmonary:     Effort: Pulmonary effort is normal.     Breath sounds: Normal breath sounds.  Abdominal:     General: Bowel sounds are normal.     Palpations: Abdomen is soft.     Tenderness: There is no abdominal tenderness.  Neurological:     Mental Status: She is alert.  Laboratory: Recent Labs  Lab 07/14/21 2050 07/15/21 0031  WBC 11.2*  --   HGB 14.6 15.0  HCT 51.7* 44.0  PLT 254  --    Recent Labs  Lab 07/14/21 2050 07/15/21 0031 07/15/21 0219  NA 145 148* 149*  K 7.0* 4.5 4.0  CL 101  --  111  CO2 18*  --  23  BUN 46*  --  49*  CREATININE 2.43*  --  2.56*  CALCIUM 10.7*  --  10.6*  GLUCOSE 916*  --  573*     Carlyn Reichert PGY-1, Psychiatry FPTS Intern pager: [phone], text pages welcome

## 2021-07-16 DIAGNOSIS — E1100 Type 2 diabetes mellitus with hyperosmolarity without nonketotic hyperglycemic-hyperosmolar coma (NKHHC): Secondary | ICD-10-CM | POA: Diagnosis not present

## 2021-07-16 DIAGNOSIS — E785 Hyperlipidemia, unspecified: Secondary | ICD-10-CM

## 2021-07-16 DIAGNOSIS — E1169 Type 2 diabetes mellitus with other specified complication: Secondary | ICD-10-CM

## 2021-07-16 DIAGNOSIS — E1165 Type 2 diabetes mellitus with hyperglycemia: Secondary | ICD-10-CM | POA: Diagnosis not present

## 2021-07-16 LAB — GLUCOSE, CAPILLARY
Glucose-Capillary: 180 mg/dL — ABNORMAL HIGH (ref 70–99)
Glucose-Capillary: 184 mg/dL — ABNORMAL HIGH (ref 70–99)
Glucose-Capillary: 190 mg/dL — ABNORMAL HIGH (ref 70–99)
Glucose-Capillary: 196 mg/dL — ABNORMAL HIGH (ref 70–99)
Glucose-Capillary: 257 mg/dL — ABNORMAL HIGH (ref 70–99)
Glucose-Capillary: 350 mg/dL — ABNORMAL HIGH (ref 70–99)
Glucose-Capillary: 373 mg/dL — ABNORMAL HIGH (ref 70–99)
Glucose-Capillary: 450 mg/dL — ABNORMAL HIGH (ref 70–99)

## 2021-07-16 LAB — BASIC METABOLIC PANEL
Anion gap: 5 (ref 5–15)
Anion gap: 11 (ref 5–15)
Anion gap: 9 (ref 5–15)
BUN: 44 mg/dL — ABNORMAL HIGH (ref 8–23)
BUN: 42 mg/dL — ABNORMAL HIGH (ref 8–23)
BUN: 45 mg/dL — ABNORMAL HIGH (ref 8–23)
CO2: 27 mmol/L (ref 22–32)
CO2: 23 mmol/L (ref 22–32)
CO2: 23 mmol/L (ref 22–32)
Calcium: 9.4 mg/dL (ref 8.9–10.3)
Calcium: 9.4 mg/dL (ref 8.9–10.3)
Calcium: 9.4 mg/dL (ref 8.9–10.3)
Chloride: 115 mmol/L — ABNORMAL HIGH (ref 98–111)
Chloride: 111 mmol/L (ref 98–111)
Chloride: 105 mmol/L (ref 98–111)
Creatinine, Ser: 1.78 mg/dL — ABNORMAL HIGH (ref 0.44–1.00)
Creatinine, Ser: 1.72 mg/dL — ABNORMAL HIGH (ref 0.44–1.00)
Creatinine, Ser: 1.66 mg/dL — ABNORMAL HIGH (ref 0.44–1.00)
GFR, Estimated: 31 mL/min — ABNORMAL LOW (ref >60)
GFR, Estimated: 32 mL/min — ABNORMAL LOW (ref >60)
GFR, Estimated: 34 mL/min — ABNORMAL LOW (ref >60)
Glucose, Bld: 194 mg/dL — ABNORMAL HIGH (ref 70–99)
Glucose, Bld: 232 mg/dL — ABNORMAL HIGH (ref 70–99)
Glucose, Bld: 416 mg/dL — ABNORMAL HIGH (ref 70–99)
Potassium: 3.8 mmol/L (ref 3.5–5.1)
Potassium: 4.4 mmol/L (ref 3.5–5.1)
Potassium: 4.1 mmol/L (ref 3.5–5.1)
Sodium: 147 mmol/L — ABNORMAL HIGH (ref 135–145)
Sodium: 145 mmol/L (ref 135–145)
Sodium: 137 mmol/L (ref 135–145)

## 2021-07-16 LAB — CBC
HCT: 39.7 % (ref 36.0–46.0)
Hemoglobin: 11.8 g/dL — ABNORMAL LOW (ref 12.0–15.0)
MCH: 26.1 pg (ref 26.0–34.0)
MCHC: 29.7 g/dL — ABNORMAL LOW (ref 30.0–36.0)
MCV: 87.8 fL (ref 80.0–100.0)
Platelets: 213 10*3/uL (ref 150–400)
RBC: 4.52 MIL/uL (ref 3.87–5.11)
RDW: 15.3 % (ref 11.5–15.5)
WBC: 12.9 10*3/uL — ABNORMAL HIGH (ref 4.0–10.5)
nRBC: 0 % (ref 0.0–0.2)

## 2021-07-16 MED ORDER — CARVEDILOL 12.5 MG PO TABS
12.5000 mg | ORAL_TABLET | Freq: Two times a day (BID) | ORAL | Status: DC
  Administered 2021-07-16 – 2021-07-19 (×7): 12.5 mg via ORAL
  Filled 2021-07-16 (×7): qty 1

## 2021-07-16 MED ORDER — ENSURE ENLIVE PO LIQD
237.0000 mL | Freq: Two times a day (BID) | ORAL | Status: DC
  Administered 2021-07-17 – 2021-07-19 (×6): 237 mL via ORAL

## 2021-07-16 MED ORDER — GLUCERNA SHAKE PO LIQD
237.0000 mL | Freq: Three times a day (TID) | ORAL | Status: DC
  Administered 2021-07-16: 237 mL via ORAL

## 2021-07-16 MED ORDER — ATORVASTATIN CALCIUM 40 MG PO TABS
40.0000 mg | ORAL_TABLET | Freq: Every day | ORAL | Status: DC
  Administered 2021-07-16 – 2021-07-18 (×3): 40 mg via ORAL
  Filled 2021-07-16 (×3): qty 1

## 2021-07-16 MED ORDER — LIVING WELL WITH DIABETES BOOK
Freq: Once | Status: AC
  Filled 2021-07-16: qty 1

## 2021-07-16 MED ORDER — INSULIN GLARGINE-YFGN 100 UNIT/ML ~~LOC~~ SOLN
15.0000 [IU] | Freq: Once | SUBCUTANEOUS | Status: AC
  Administered 2021-07-16: 15 [IU] via SUBCUTANEOUS
  Filled 2021-07-16: qty 0.15

## 2021-07-16 MED ORDER — ENOXAPARIN SODIUM 40 MG/0.4ML IJ SOSY
40.0000 mg | PREFILLED_SYRINGE | INTRAMUSCULAR | Status: DC
  Administered 2021-07-17 – 2021-07-19 (×3): 40 mg via SUBCUTANEOUS
  Filled 2021-07-16 (×3): qty 0.4

## 2021-07-16 MED ORDER — INSULIN ASPART 100 UNIT/ML IJ SOLN
0.0000 [IU] | Freq: Three times a day (TID) | INTRAMUSCULAR | Status: DC
  Administered 2021-07-16: 8 [IU] via SUBCUTANEOUS
  Administered 2021-07-16: 15 [IU] via SUBCUTANEOUS
  Administered 2021-07-16: 16 [IU] via SUBCUTANEOUS
  Administered 2021-07-17: 8 [IU] via SUBCUTANEOUS

## 2021-07-16 NOTE — Progress Notes (Addendum)
Family Medicine Teaching Service Daily Progress Note Intern Pager: [phone]  Patient name: Autumn James Medical record number: [id number] Date of birth: Mar 10, 1956 Age: 65 y.o. Gender: female  Primary Care Provider: Pcp, No Consultants: none Code Status: FULL  Pt Overview and Major Events to Date:  9/26: admitted with glc of 916, endo tool, gap closed 9/27: gap back at 14, endo tool started and then D/C'd  Assessment and Plan: The patient is a 65 year old female presenting with hyperglycemia and fatigue.  Past medical history significant for T2DM, hypertension, and hyperlipidemia.  Hyperglycemia in the context of T2DM, likely DKA Patient with anion gap of 14 overnight, Endo tool restarted and then DC'd once anion gap resolved to 5.  CBGs from a high of 361 to 257 most recently.  Patient given 15 units of glargine at 2 AM, on top of 30 units given at 3 PM yesterday. Pt received a total of 140U of insulin over the past 24hrs. Currently on D5 half-normal saline.  K of 4.4. Patient reports not having an appetite and not eating currently. She was encouraged to start taking in PO.  - Stop D5 half-normal saline given that she is off insulin drip - Give 15 units of glargine now - mSSI -- Consider prandial insulin if patient starts eating - Follow-up daytime CBGs - Follow-up afternoon BMP and AM BMP tomorrow - Continue maintenance IV fluids, 1/2NS at 125 mL/h - D50 IV for hypoglycemia - Carb modified diet given patient off Endo tool -- Glucerna TID to help with PO intake -- RD consult to determine optimal PO intake - PT/OT  AKI on CKD (?) Unsure if patient has pre-existing renal disease.  Creatinine of 1.05 in 2019.  Unable to obtain further records yesterday.  Creatinine of 2.43 on admission with a BUN of 46.  Creatinine currently 1.78. - Follow-up BMPs as above - Maintenance IV fluids as above - Hold home losartan - Avoid nephrotoxins  Hypertension Blood pressure 140-170  systolic.  Patient reports noncompliance with home regimen. - Restart home Coreg 12.5 mg twice daily - Continue to hold home hydralazine 25 mg 3 times daily - Continue to hold home losartan 100 mg daily  Leukocytosis Unknown origin. Increased to 12.9 this AM. Pt afebrile with negative CRX and UA. Will check CBC again tomorrow morning. If still elevated will consider repeat CXR and UA. -CBC tomorrow a.m.  Hyperlipidemia Chronic. - Restart home atorvastatin 40 mg daily  FEN/GI: Carb modified diet PPx: Lovenox Dispo:pending PT/OT eval  Subjective:  On assessment this morning, patient reports poor PO intake secondary to poor appetite. She was encouraged to eat meals. She was more alert and oriented this morning. She was able to provide the name of her PCP's practice in [place]. She denies pain and has no other complaints.   Objective: Temp:  [98 F (36.7 C)-98.4 F (36.9 C)] 98.2 F (36.8 C) (09/27 0252) Pulse Rate:  [84-104] 93 (09/27 0252) Resp:  [7-29] 13 (09/27 0252) BP: (118-178)/(60-94) 156/81 (09/27 0252) SpO2:  [93 %-98 %] 95 % (09/27 0252) Weight:  [245 lb 6 oz (111.3 kg)] 245 lb 6 oz (111.3 kg) (09/26 2018) Physical Exam Vitals reviewed.  Cardiovascular:     Rate and Rhythm: Normal rate and regular rhythm.     Pulses: Normal pulses.     Heart sounds: Normal heart sounds. No murmur heard. Pulmonary:     Effort: Pulmonary effort is normal.     Breath sounds: Normal breath sounds.  Abdominal:  General: Bowel sounds are normal.     Palpations: Abdomen is soft.     Tenderness: There is no abdominal tenderness.  Neurological:     Mental Status: She is alert and oriented to person, place, and time.     Laboratory: Recent Labs  Lab 07/14/21 2050 07/15/21 0031 07/16/21 0351  WBC 11.2*  --  12.9*  HGB 14.6 15.0 11.8*  HCT 51.7* 44.0 39.7  PLT 254  --  213   Recent Labs  Lab 07/15/21 0641 07/15/21 1234 07/15/21 1809 07/15/21 2233 07/16/21 0351  NA 154*   < >  150* 149* 147*  K 3.9   < > 4.2 4.4 3.8  CL 113*   < > 113* 113* 115*  CO2 28   < > 23 23 27   BUN 50*   < > 50* 49* 44*  CREATININE 2.56*   < > 2.30* 2.22* 1.78*  CALCIUM 10.7*   < > 9.9 10.2 9.4  PROT 7.7  --   --   --   --   BILITOT 1.1  --   --   --   --   ALKPHOS 79  --   --   --   --   ALT 14  --   --   --   --   AST 12*  --   --   --   --   GLUCOSE 389*   < > 272* 294* 194*   < > = values in this interval not displayed.    Imaging/Diagnostic Tests: CBGs and BMPs as above  PGY-1, Psychiatry FPTS Intern pager: [phone], text pages welcome

## 2021-07-16 NOTE — Progress Notes (Signed)
Initial Nutrition Assessment  DOCUMENTATION CODES:   Not applicable  INTERVENTION:   - Ensure Enlive po BID, each supplement provides 350 kcal and 20 grams of protein  - Multivitamin w/ minerals daily  - Consider liberalizing diet to increase pt intake        - Encourage good intake   NUTRITION DIAGNOSIS:   Inadequate oral intake related to poor appetite as evidenced by per patient/family report.  GOAL:   Patient will meet greater than or equal to 90% of their needs  MONITOR:   PO intake, Supplement acceptance, Labs  REASON FOR ASSESSMENT:   Consult Assessment of nutrition requirement/status  ASSESSMENT:   65 y.o. female from [place] presented with hyperglycemia and poor controlled DM. PMH of T2DM and HTN.   Pt reports that her appetite has been poor for about a year, unknown reason for poor intake. Reports a typical intake of; Breakfast: egg w/ cheese and sausage; Lunch: [place] sandwich (2 bites); Dinner: [place] sandwich (2 bites). Pt reports that she lives with her granddaughter and they share the responsibility of cooking and grocery shopping.   Pt's lunch tray present in room at time of visit, observed <50% of the tray consumed. Reported she has an Ensure this morning and liked it; pt has Glucerna ordered may have been that opposed from Ensure. Will change pt to Ensure due to poor intake.   Reports that she does not do anything to manage her diabetes an that is why she is here. Diabetes coordinator following.   Pt reports that her UBW is 250-270#, was not exactly sure. Reports no recent weight changes. No previous medical records in EMR [place]. Pt reports that she ambulates with a cane and a walker at home and outside the home.    Medications reviewed and include: SSI 0-15 units w/ meals Labs reviewed: Hgb A1c 14.3%, 24 hr BG trends 180-373   NUTRITION - FOCUSED PHYSICAL EXAM:  Flowsheet Row Most Recent Value  Orbital Region No  depletion  Upper Arm Region No depletion  Thoracic and Lumbar Region No depletion  Buccal Region No depletion  Temple Region No depletion  Clavicle Bone Region No depletion  Clavicle and Acromion Bone Region No depletion  Scapular Bone Region No depletion  Dorsal Hand No depletion  Patellar Region No depletion  Anterior Thigh Region No depletion  Posterior Calf Region No depletion  Edema (RD Assessment) None  Hair Reviewed  Eyes Reviewed  Mouth Reviewed  Skin Reviewed  Nails Reviewed       Diet Order:   Diet Order             Diet Carb Modified Fluid consistency: Thin; Room service appropriate? Yes  Diet effective now                   EDUCATION NEEDS:   No education needs have been identified at this time  Skin:  Skin Assessment: Reviewed RN Assessment  Last BM:  9/27  Height:   Ht Readings from Last 1 Encounters:  07/15/21 5\' 5"  (1.651 m)    Weight:   Wt Readings from Last 1 Encounters:  07/15/21 111.3 kg    Ideal Body Weight:  56.8 kg  BMI:  Body mass index is 40.83 kg/m.  Estimated Nutritional Needs:   Kcal:  2000-2200  Protein:  100-115 grams  Fluid:  >/= 2 L    Chani Ghanem 07/17/21, PLDN Clinical Dietitian See AMiON for  contact information.

## 2021-07-16 NOTE — Progress Notes (Signed)
FPTS Brief Progress Note  S: Sleeping.    O: BP (!) 145/87   Pulse 77   Temp 98.6 F (37 C) (Oral)   Resp 15   Ht 5\' 5"  (1.651 m)   Wt 111.3 kg   SpO2 95%   BMI 40.83 kg/m   General: Sleeping soundly supine in bed, no acute distress. Age appropriate. Respiratory: normal effort  A/P: Hyperglycemia in the context of T2DM, likely DKA Most recent CBG is 350 will continue to monitor and give insulin as needed.  - Orders reviewed. Labs for AM ordered, which was adjusted as needed.   , DO 07/16/2021, 11:30 PM PGY-3, [facility] Night Resident  Please page [phone] with questions.

## 2021-07-16 NOTE — Progress Notes (Signed)
Inpatient Diabetes Program Recommendations  AACE/ADA: New Consensus Statement on Inpatient Glycemic Control (2015)  Target Ranges:  Prepandial:   less than 140 mg/dL      Peak postprandial:   less than 180 mg/dL (1-2 hours)      Critically ill patients:  140 - 180 mg/dL   Lab Results  Component Value Date   GLUCAP 373 (H) 07/16/2021   HGBA1C 14.3 (H) 07/15/2021    Review of Glycemic Control  Inpatient Diabetes Program Recommendations:   Spoke with pt about A1C results 14.3 (average blood glucose 364 over the past 2-3 months) and explained what an A1C is, basic pathophysiology of DM Type 2, basic home care, basic diabetes diet nutrition principles, importance of checking CBGs and maintaining good CBG control to prevent long-term and short-term complications. Reviewed signs and symptoms of hyperglycemia and hypoglycemia and how to treat hypoglycemia at home. Also reviewed blood sugar goals at home.  Living Well With Diabetes booklet ordered for pt. To review and patient plans to review with her granddaughters. Patient shared that she knows she needs to take her medications and her grand daughters are encouraging her to care for herself. States she plans to take her medications and "not stress my family".  Thank you, Billy Fischer. Alazae Crymes, RN, MSN, CDE  Diabetes Coordinator Inpatient Glycemic Control Team Team Pager [phone] (8am-5pm) 07/16/2021 12:58 PM

## 2021-07-16 NOTE — Evaluation (Signed)
Occupational Therapy Evaluation Patient Details Name: Autumn James MRN: [id number] DOB: 08/17/1956 Today's Date: 07/16/2021   History of Present Illness Autumn James is a 65 y.o. female presenting with fatigue and hyperglycemia. Pt admitted for further work-up of DKA vs HHS due to DMII and insulin noncompliance. PMH: DM, HTN, HLD.   Clinical Impression   PTA, pt lives in [place] with her granddaughter and reports Modified Independence with ADLs and mobility using quad cane. Pt presents now limited by decreased endurance and lethargy. Pt able to mobilize to/from bathroom using RW at min guard, Independent for UB ADLs and Min A for LB ADLs (including toileting during session). Anticipate pt to progress quickly with no OT needs at DC. Will continue to follow acutely to progress endurance, ADL independence and educate on energy conservation strategies as needed.       Recommendations for follow up therapy are one component of a multi-disciplinary discharge planning process, led by the attending physician.  Recommendations may be updated based on patient status, additional functional criteria and insurance authorization.   Follow Up Recommendations  No OT follow up    Equipment Recommendations  None recommended by OT    Recommendations for Other Services       Precautions / Restrictions Precautions Precautions: Fall;Other (comment) Precaution Comments: urine incontinence Restrictions Weight Bearing Restrictions: No      Mobility Bed Mobility               General bed mobility comments: received after hallway mobility with PT    Transfers Overall transfer level: Needs assistance Equipment used: Rolling walker (2 wheeled) Transfers: Sit to/from Stand Sit to Stand: Min guard         General transfer comment: miin guard with increased time to power up from regular toilet, use of grab bars    Balance Overall balance assessment: Needs assistance Sitting-balance  support: No upper extremity supported;Feet supported Sitting balance-Leahy Scale: Good     Standing balance support: Bilateral upper extremity supported;During functional activity Standing balance-Leahy Scale: Fair Standing balance comment: fair static standing for clothing mgmt in bathroom and hand hygiene standing at sink. beneifts from BUE support for mobility                           ADL either performed or assessed with clinical judgement   ADL Overall ADL's : Needs assistance/impaired Eating/Feeding: Independent;Sitting   Grooming: Set up;Standing;Wash/dry hands Grooming Details (indicate cue type and reason): no physical assist needed; leans against sink Upper Body Bathing: Independent;Sitting   Lower Body Bathing: Minimal assistance;Sit to/from stand   Upper Body Dressing : Independent;Sitting   Lower Body Dressing: Minimal assistance;Sit to/from stand   Toilet Transfer: Min guard;Ambulation;Regular Toilet;Grab bars;RW Statistician Details (indicate cue type and reason): min guard for safety, no overt LOB, slow pacing Toileting- Clothing Manipulation and Hygiene: Minimal assistance;Sit to/from stand Toileting - Clothing Manipulation Details (indicate cue type and reason): assist for underwear over posterior region     Functional mobility during ADLs: Min guard;Rolling walker General ADL Comments: Pt limited by fatigue with basic ADLs this AM, not far from baseline     Vision Baseline Vision/History: 1 Wears glasses Ability to See in Adequate Light: 1 Impaired Patient Visual Report: No change from baseline Vision Assessment?: No apparent visual deficits     Perception     Praxis      Pertinent Vitals/Pain Pain Assessment: No/denies pain     Hand Dominance  Right   Extremity/Trunk Assessment Upper Extremity Assessment Upper Extremity Assessment: Overall WFL for tasks assessed   Lower Extremity Assessment Lower Extremity Assessment: Defer to  PT evaluation   Cervical / Trunk Assessment Cervical / Trunk Assessment: Normal   Communication Communication Communication: No difficulties   Cognition Arousal/Alertness: Awake/alert Behavior During Therapy: WFL for tasks assessed/performed Overall Cognitive Status: No family/caregiver present to determine baseline cognitive functioning Area of Impairment: Problem solving                             Problem Solving: Slow processing;Requires verbal cues General Comments: WFL for basic tasks, noted slower processing and response time but able to follow all directions. Pt does endorse fatigue so may be contributing, likely close to baseline   General Comments  HR up to 113bpm with activity    Exercises     Shoulder Instructions      Home Living Family/patient expects to be discharged to:: Private residence Living Arrangements: Other relatives (granddaughter in [place], Is visiting son here) Available Help at Discharge: Family   Home Access: Stairs to enter Secretary/administrator of Steps: 4 Entrance Stairs-Rails: Right Home Layout: One level     Bathroom Shower/Tub: Chief Strategy Officer: Standard     Home Equipment: Environmental consultant - 2 wheels;Grab bars - tub/shower;Shower seat;Cane - quad   Additional Comments: Above is for her home in [place]. Her sons home in [place] has 2 levels with bedroom upstairs      Prior Functioning/Environment Level of Independence: Independent with assistive device(s)        Comments: Uses quad cane for mobility, able to complete ADLs (use of shower chair).        OT Problem List: Decreased strength;Decreased activity tolerance;Impaired balance (sitting and/or standing)      OT Treatment/Interventions: Self-care/ADL training;Therapeutic exercise;Energy conservation;DME and/or AE instruction;Therapeutic activities;Patient/family education;Balance training    OT Goals(Current goals can be found in the care plan section)  Acute Rehab OT Goals Patient Stated Goal: get some rest OT Goal Formulation: With patient Time For Goal Achievement: 07/30/21 Potential to Achieve Goals: Good  OT Frequency: Min 2X/week   Barriers to D/C:            Co-evaluation              AM-PAC OT "6 Clicks" Daily Activity     Outcome Measure Help from another person eating meals?: None Help from another person taking care of personal grooming?: A Little Help from another person toileting, which includes using toliet, bedpan, or urinal?: A Little Help from another person bathing (including washing, rinsing, drying)?: A Little Help from another person to put on and taking off regular upper body clothing?: None Help from another person to put on and taking off regular lower body clothing?: A Little 6 Click Score: 20   End of Session Equipment Utilized During Treatment: Gait belt;Rolling walker  Activity Tolerance: Patient tolerated treatment well Patient left: in chair;with call bell/phone within reach;with chair alarm set  OT Visit Diagnosis: Unsteadiness on feet (R26.81);Other abnormalities of gait and mobility (R26.89)                Time: 5400-8676 OT Time Calculation (min): 20 min Charges:  OT General Charges $OT Visit: 1 Visit OT Evaluation $OT Eval Low Complexity: 1 Low  Bradd Canary, OTR/L Acute Rehab Services Office: [phone]   Lorre Munroe 07/16/2021, 10:15 AM

## 2021-07-16 NOTE — Evaluation (Signed)
Physical Therapy Evaluation Patient Details Name: Autumn James MRN: [id number] DOB: 1956-07-25 Today's Date: 07/16/2021  History of Present Illness  Autumn James is a 65 y.o. female presenting with fatigue and hyperglycemia. Pt admitted for further work-up of DKA vs HHS due to DMII and insulin noncompliance. PMH: DM, HTN, HLD.  Clinical Impression  Pt presents to PT with slight decrease in mobility due to illness and inactivity. Expect pt will make good progress back to baseline with mobility. Will follow acutely but doubt pt will need PT after DC. Pt is visiting son in [place] and plans to return to [place] soon after DC. May need a few days after DC prior to flying back.        Recommendations for follow up therapy are one component of a multi-disciplinary discharge planning process, led by the attending physician.  Recommendations may be updated based on patient status, additional functional criteria and insurance authorization.  Follow Up Recommendations No PT follow up    Equipment Recommendations  None recommended by PT    Recommendations for Other Services       Precautions / Restrictions Precautions Precautions: Fall;Other (comment) Precaution Comments: urine incontinence Restrictions Weight Bearing Restrictions: No      Mobility  Bed Mobility Overal bed mobility: Needs Assistance Bed Mobility: Sidelying to Sit   Sidelying to sit: Min guard;HOB elevated       General bed mobility comments: Use of rail and verbal cues for technique. Inct time to complete    Transfers Overall transfer level: Needs assistance Equipment used: Rolling walker (2 wheeled) Transfers: Sit to/from Stand Sit to Stand: Min guard         General transfer comment: Assist for safety and lines  Ambulation/Gait Ambulation/Gait assistance: Supervision Gait Distance (Feet): 100 Feet Assistive device: Rolling walker (2 wheeled) Gait Pattern/deviations: Step-through  pattern;Decreased stride length Gait velocity: decr Gait velocity interpretation: <1.31 ft/sec, indicative of household ambulator General Gait Details: Assist for lines and Wellsite geologist    Modified Rankin (Stroke Patients Only)       Balance Overall balance assessment: Needs assistance Sitting-balance support: No upper extremity supported;Feet supported Sitting balance-Leahy Scale: Good     Standing balance support: No upper extremity supported;During functional activity Standing balance-Leahy Scale: Fair Standing balance comment: fair static standing for clothing mgmt in bathroom and hand hygiene standing at sink. beneifts from BUE support for mobility                             Pertinent Vitals/Pain Pain Assessment: No/denies pain    Home Living Family/patient expects to be discharged to:: Private residence Living Arrangements: Other relatives (granddaughter in [place], Is visiting son here) Available Help at Discharge: Family   Home Access: Stairs to enter Entrance Stairs-Rails: Right Entrance Stairs-Number of Steps: 4 Home Layout: One level Home Equipment: Walker - 2 wheels;Grab bars - tub/shower;Shower seat;Cane - quad Additional Comments: Above is for her home in [place]. Her sons home in [place] has 2 levels with bedroom upstairs    Prior Function Level of Independence: Independent with assistive device(s)         Comments: Uses quad cane for mobility, able to complete ADLs (use of shower chair).     Hand Dominance   Dominant Hand: Right    Extremity/Trunk Assessment   Upper Extremity Assessment Upper Extremity Assessment: Defer to OT evaluation  Lower Extremity Assessment Lower Extremity Assessment: Generalized weakness    Cervical / Trunk Assessment Cervical / Trunk Assessment: Normal  Communication   Communication: No difficulties  Cognition Arousal/Alertness: Awake/alert Behavior During  Therapy: WFL for tasks assessed/performed Overall Cognitive Status: No family/caregiver present to determine baseline cognitive functioning Area of Impairment: Problem solving                             Problem Solving: Slow processing;Requires verbal cues General Comments: May be close to her baseline      General Comments General comments (skin integrity, edema, etc.): VSS on RA    Exercises     Assessment/Plan    PT Assessment Patient needs continued PT services  PT Problem List Decreased strength;Decreased balance;Decreased mobility;Decreased activity tolerance       PT Treatment Interventions DME instruction;Functional mobility training;Balance training;Patient/family education;Therapeutic activities;Gait training;Stair training;Therapeutic exercise    PT Goals (Current goals can be found in the Care Plan section)  Acute Rehab PT Goals Patient Stated Goal: get some rest PT Goal Formulation: With patient Time For Goal Achievement: 07/30/21 Potential to Achieve Goals: Good    Frequency Min 3X/week   Barriers to discharge        Co-evaluation               AM-PAC PT "6 Clicks" Mobility  Outcome Measure Help needed turning from your back to your side while in a flat bed without using bedrails?: A Little Help needed moving from lying on your back to sitting on the side of a flat bed without using bedrails?: A Little Help needed moving to and from a bed to a chair (including a wheelchair)?: A Little Help needed standing up from a chair using your arms (e.g., wheelchair or bedside chair)?: A Little Help needed to walk in hospital room?: A Little Help needed climbing 3-5 steps with a railing? : A Little 6 Click Score: 18    End of Session Equipment Utilized During Treatment: Gait belt Activity Tolerance: Patient tolerated treatment well Patient left: Other (comment) (in bathroom with OT present)   PT Visit Diagnosis: Other abnormalities of gait  and mobility (R26.89);Muscle weakness (generalized) (M62.81)    Time: 1610-9604 PT Time Calculation (min) (ACUTE ONLY): 21 min   Charges:   PT Evaluation $PT Eval Moderate Complexity: 1 Mod          [facility] PT Acute Rehabilitation Services Pager [phone] Office [phone]   Angelina Ok [facility] 07/16/2021, 11:30 AM

## 2021-07-17 ENCOUNTER — Other Ambulatory Visit (HOSPITAL_COMMUNITY): Payer: Self-pay

## 2021-07-17 DIAGNOSIS — E1110 Type 2 diabetes mellitus with ketoacidosis without coma: Secondary | ICD-10-CM | POA: Diagnosis not present

## 2021-07-17 LAB — CBC
HCT: 38.7 % (ref 36.0–46.0)
Hemoglobin: 12 g/dL (ref 12.0–15.0)
MCH: 26.9 pg (ref 26.0–34.0)
MCHC: 31 g/dL (ref 30.0–36.0)
MCV: 86.8 fL (ref 80.0–100.0)
Platelets: 168 10*3/uL (ref 150–400)
RBC: 4.46 MIL/uL (ref 3.87–5.11)
RDW: 14.7 % (ref 11.5–15.5)
WBC: 8.5 10*3/uL (ref 4.0–10.5)
nRBC: 0.2 % (ref 0.0–0.2)

## 2021-07-17 LAB — BASIC METABOLIC PANEL
Anion gap: 7 (ref 5–15)
Anion gap: 7 (ref 5–15)
BUN: 43 mg/dL — ABNORMAL HIGH (ref 8–23)
BUN: 36 mg/dL — ABNORMAL HIGH (ref 8–23)
CO2: 26 mmol/L (ref 22–32)
CO2: 23 mmol/L (ref 22–32)
Calcium: 9.2 mg/dL (ref 8.9–10.3)
Calcium: 8.4 mg/dL — ABNORMAL LOW (ref 8.9–10.3)
Chloride: 107 mmol/L (ref 98–111)
Chloride: 103 mmol/L (ref 98–111)
Creatinine, Ser: 1.5 mg/dL — ABNORMAL HIGH (ref 0.44–1.00)
Creatinine, Ser: 1.36 mg/dL — ABNORMAL HIGH (ref 0.44–1.00)
GFR, Estimated: 38 mL/min — ABNORMAL LOW (ref >60)
GFR, Estimated: 43 mL/min — ABNORMAL LOW (ref >60)
Glucose, Bld: 337 mg/dL — ABNORMAL HIGH (ref 70–99)
Glucose, Bld: 391 mg/dL — ABNORMAL HIGH (ref 70–99)
Potassium: 3.9 mmol/L (ref 3.5–5.1)
Potassium: 3.7 mmol/L (ref 3.5–5.1)
Sodium: 140 mmol/L (ref 135–145)
Sodium: 133 mmol/L — ABNORMAL LOW (ref 135–145)

## 2021-07-17 LAB — GLUCOSE, CAPILLARY
Glucose-Capillary: 298 mg/dL — ABNORMAL HIGH (ref 70–99)
Glucose-Capillary: 280 mg/dL — ABNORMAL HIGH (ref 70–99)
Glucose-Capillary: 264 mg/dL — ABNORMAL HIGH (ref 70–99)
Glucose-Capillary: 371 mg/dL — ABNORMAL HIGH (ref 70–99)
Glucose-Capillary: 223 mg/dL — ABNORMAL HIGH (ref 70–99)
Glucose-Capillary: 187 mg/dL — ABNORMAL HIGH (ref 70–99)

## 2021-07-17 MED ORDER — INSULIN ASPART 100 UNIT/ML IJ SOLN: 10.0000 [IU] | Freq: Three times a day (TID) | INTRAMUSCULAR | Status: DC

## 2021-07-17 MED ORDER — INSULIN GLARGINE-YFGN 100 UNIT/ML ~~LOC~~ SOLN
45.0000 [IU] | Freq: Every day | SUBCUTANEOUS | Status: DC
  Administered 2021-07-17: 45 [IU] via SUBCUTANEOUS
  Filled 2021-07-17 (×2): qty 0.45

## 2021-07-17 MED ORDER — INSULIN ASPART 100 UNIT/ML IJ SOLN
0.0000 [IU] | Freq: Three times a day (TID) | INTRAMUSCULAR | Status: DC
  Administered 2021-07-17: 7 [IU] via SUBCUTANEOUS
  Administered 2021-07-17: 20 [IU] via SUBCUTANEOUS
  Administered 2021-07-17: 11 [IU] via SUBCUTANEOUS
  Administered 2021-07-18: 15 [IU] via SUBCUTANEOUS
  Administered 2021-07-18: 20 [IU] via SUBCUTANEOUS
  Administered 2021-07-18: 7 [IU] via SUBCUTANEOUS
  Administered 2021-07-19: 4 [IU] via SUBCUTANEOUS
  Administered 2021-07-19: 7 [IU] via SUBCUTANEOUS

## 2021-07-17 NOTE — TOC Benefit Eligibility Note (Addendum)
Patient Teacher, English as a foreign language completed.    The patient is currently admitted and upon discharge could be taking Jardiance 10 mg.  The current 30 day co-pay is, $347.00 due to a $300.00 deductible remaining.  Should be $47.00 a month after deductible is met.   The patient is currently admitted and upon discharge could be taking Lantus Pens.  The current 30 day co-pay is, $35.00  The patient is currently admitted and upon discharge could be taking Novolog Pens.  Non Formulary  The patient is currently admitted and upon discharge could be taking Humalog Kwik Pens.  The current 30 day co-pay is, $35.00  The patient is insured through [place], [place] Patient Advocate Specialist [place] Team Direct Number: [phone]  Fax: [phone]

## 2021-07-17 NOTE — Care Management Important Message (Signed)
Important Message  Patient Details  Name: Autumn James MRN: [id number] Date of Birth: August 29, 1956   Medicare Important Message Given:  Yes     [name] 07/17/2021, 3:21 PM

## 2021-07-17 NOTE — Plan of Care (Signed)

## 2021-07-17 NOTE — Progress Notes (Signed)
Family Medicine Teaching Service Daily Progress Note Intern Pager: [phone]  Patient name: Autumn James Medical record number: [id number] Date of birth: 1956/01/09 Age: 65 y.o. Gender: female  Primary Care Provider: Pcp, No Consultants: none Code Status: FULL  Pt Overview and Major Events to Date:  9/26: admitted with glc of 916, endo tool, gap closed 9/27: gap back at 14, endo tool started and then D/C'd 9/28: managed with SQ insulin  Assessment and Plan: The patient is a 65 year old female presenting with hyperglycemia and fatigue.  Past medical history significant for T2DM, hypertension, and hyperlipidemia.  Hyperglycemia in the context of T2DM, likely DKA CBG elevated to 373 at 11 AM, given 15 units NovoLog.  Next check still elevated at 450, given 16 units of NovoLog.  Nightly check at 350, no insulin given. Patient clearly insulin resistant given this and needing to go on endotool twice. Nursing reports patient ate all of dinner and an ensure and has been drinking water well. CBG of 280 this morning (unsure if fasting). Patient given 45U of glargine, put on 10U of prandial coverage, and placed on resistant sliding scale insulin. No elevated anion gap overnight. --Glargine 45 units daily --Add 10U prandial insulin --switch to resistant SSI --Talked with patient and nursing to communicate importance of PO --Ensure Enlive BID today --D/C mIVF given patient is drinking well and does not appear volume down --Follow daytime CBGs --Repeat BMP this afternoon --D/C cardiac monitoring  AKI on CKD (?) Unsure if patient has pre-existing renal disease. Pt cannot tell us her primary care provider and family has not been helpful. Cr of 1.05 in 2019. Cr of 2.4 on admission. This AM 1.5, AKI clearly improving, likely pre-renal in etiology.  -F/U afternoon BMP as above -D/C mIVF as patient is taking in good PO -Hold home losartan -Avoid nephrotoxins  Hypertension Blood pressure ~120/80  with addition of Coreg yesterday. - Continue home Coreg 12.5 mg twice daily - Continue to hold home hydralazine 25 mg 3 times daily - Continue to hold home losartan 100 mg daily  Leukocytosis (resolved) Unknown origin. From 12.9 to 8 this AM.  -Recheck CBC tomorrow  Hyperlipidemia Chronic condition -Continue home atorvastatin 40 mg daily  FEN/GI: regular diet to encourage PO intake PPx: Lovenox 40 mg Dispo: home, no PT/OT f/u recommended  Subjective:  On interview this morning, patient exhibits continued improvement. She is alert and oriented and able to converse well. She does exhibit noticeable memory impairment and asks for things to be repeated.  Objective: Temp:  [98 F (36.7 C)-98.8 F (37.1 C)] 98.1 F (36.7 C) (09/28 0355) Pulse Rate:  [77-80] 79 (09/28 0355) Resp:  [11-17] 13 (09/28 0355) BP: (117-183)/(69-102) 128/72 (09/28 0355) SpO2:  [95 %-98 %] 97 % (09/28 0355) Physical Exam Cardiovascular:     Rate and Rhythm: Regular rhythm.     Heart sounds: No murmur heard.    Comments: No dependent edema in the hips, 1+ edema in the legs Pulmonary:     Effort: Pulmonary effort is normal.     Breath sounds: Normal breath sounds.  Abdominal:     General: Bowel sounds are normal.     Palpations: Abdomen is soft.     Tenderness: There is no abdominal tenderness.  Neurological:     Mental Status: She is alert.     Laboratory: Recent Labs  Lab 07/14/21 2050 07/15/21 0031 07/16/21 0351 07/17/21 0043  WBC 11.2*  --  12.9* 8.5  HGB 14.6 15.0 11.8* 12.0  HCT 51.7* 44.0 39.7 38.7  PLT 254  --  213 168   Recent Labs  Lab 07/15/21 0641 07/15/21 1234 07/16/21 0757 07/16/21 1940 07/17/21 0043  NA 154*   < > 145 137 140  K 3.9   < > 4.4 4.1 3.9  CL 113*   < > 111 105 107  CO2 28   < > 23 23 26   BUN 50*   < > 42* 45* 43*  CREATININE 2.56*   < > 1.72* 1.66* 1.50*  CALCIUM 10.7*   < > 9.4 9.4 9.2  PROT 7.7  --   --   --   --   BILITOT 1.1  --   --   --   --    ALKPHOS 79  --   --   --   --   ALT 14  --   --   --   --   AST 12*  --   --   --   --   GLUCOSE 389*   < > 232* 416* 337*   < > = values in this interval not displayed.    Imaging/Diagnostic Tests: None new   PGY-1, Psychiatry FPTS Intern pager: [phone], text pages welcome

## 2021-07-18 DIAGNOSIS — E1110 Type 2 diabetes mellitus with ketoacidosis without coma: Secondary | ICD-10-CM | POA: Diagnosis not present

## 2021-07-18 DIAGNOSIS — R739 Hyperglycemia, unspecified: Secondary | ICD-10-CM | POA: Diagnosis not present

## 2021-07-18 DIAGNOSIS — E785 Hyperlipidemia, unspecified: Secondary | ICD-10-CM

## 2021-07-18 DIAGNOSIS — E1100 Type 2 diabetes mellitus with hyperosmolarity without nonketotic hyperglycemic-hyperosmolar coma (NKHHC): Secondary | ICD-10-CM | POA: Diagnosis not present

## 2021-07-18 DIAGNOSIS — E1169 Type 2 diabetes mellitus with other specified complication: Secondary | ICD-10-CM | POA: Diagnosis not present

## 2021-07-18 LAB — CBC WITH DIFFERENTIAL/PLATELET
Abs Immature Granulocytes: 0.03 10*3/uL (ref 0.00–0.07)
Basophils Absolute: 0 10*3/uL (ref 0.0–0.1)
Basophils Relative: 1 %
Eosinophils Absolute: 0.1 10*3/uL (ref 0.0–0.5)
Eosinophils Relative: 2 %
HCT: 35.7 % — ABNORMAL LOW (ref 36.0–46.0)
Hemoglobin: 11.3 g/dL — ABNORMAL LOW (ref 12.0–15.0)
Immature Granulocytes: 1 %
Lymphocytes Relative: 45 %
Lymphs Abs: 3 10*3/uL (ref 0.7–4.0)
MCH: 26.8 pg (ref 26.0–34.0)
MCHC: 31.7 g/dL (ref 30.0–36.0)
MCV: 84.8 fL (ref 80.0–100.0)
Monocytes Absolute: 0.4 10*3/uL (ref 0.1–1.0)
Monocytes Relative: 6 %
Neutro Abs: 3.1 10*3/uL (ref 1.7–7.7)
Neutrophils Relative %: 45 %
Platelets: 166 10*3/uL (ref 150–400)
RBC: 4.21 MIL/uL (ref 3.87–5.11)
RDW: 14 % (ref 11.5–15.5)
WBC: 6.6 10*3/uL (ref 4.0–10.5)
nRBC: 0 % (ref 0.0–0.2)

## 2021-07-18 LAB — BASIC METABOLIC PANEL
Anion gap: 7 (ref 5–15)
BUN: 28 mg/dL — ABNORMAL HIGH (ref 8–23)
CO2: 24 mmol/L (ref 22–32)
Calcium: 8.9 mg/dL (ref 8.9–10.3)
Chloride: 105 mmol/L (ref 98–111)
Creatinine, Ser: 1.22 mg/dL — ABNORMAL HIGH (ref 0.44–1.00)
GFR, Estimated: 49 mL/min — ABNORMAL LOW (ref >60)
Glucose, Bld: 250 mg/dL — ABNORMAL HIGH (ref 70–99)
Potassium: 3.9 mmol/L (ref 3.5–5.1)
Sodium: 136 mmol/L (ref 135–145)

## 2021-07-18 LAB — GLUCOSE, CAPILLARY
Glucose-Capillary: 240 mg/dL — ABNORMAL HIGH (ref 70–99)
Glucose-Capillary: 317 mg/dL — ABNORMAL HIGH (ref 70–99)
Glucose-Capillary: 368 mg/dL — ABNORMAL HIGH (ref 70–99)
Glucose-Capillary: 208 mg/dL — ABNORMAL HIGH (ref 70–99)

## 2021-07-18 MED ORDER — INSULIN GLARGINE-YFGN 100 UNIT/ML ~~LOC~~ SOLN
60.0000 [IU] | Freq: Every day | SUBCUTANEOUS | Status: DC
  Administered 2021-07-18 – 2021-07-19 (×2): 60 [IU] via SUBCUTANEOUS
  Filled 2021-07-18 (×2): qty 0.6

## 2021-07-18 MED ORDER — LOSARTAN POTASSIUM 25 MG PO TABS
25.0000 mg | ORAL_TABLET | Freq: Every day | ORAL | Status: DC
  Administered 2021-07-18 – 2021-07-19 (×2): 25 mg via ORAL
  Filled 2021-07-18 (×2): qty 1

## 2021-07-18 NOTE — Progress Notes (Signed)
FPTS Brief Progress Note  S: Sleeping   O: BP 127/73 (BP Location: Left Wrist)   Pulse 71   Temp 98.2 F (36.8 C) (Oral)   Resp 13   Ht 5\' 5"  (1.651 m)   Wt 111.3 kg   SpO2 97%   BMI 40.83 kg/m   General: Sleeping comfortably in hospital bed, no acute distress. Age appropriate. Respiratory: normal effort   A/P: Hyperglycemia in the context of T2DM, likely DKA - Orders reviewed. Labs for AM ordered, which was adjusted as needed.   , DO 07/18/2021, 1:38 AM PGY-3, [facility] Night Resident  Please page [phone] with questions.

## 2021-07-18 NOTE — Progress Notes (Signed)
PT Cancellation Note  Patient Details Name: Autumn James MRN: [id number] DOB: May 17, 1956   Cancelled Treatment:    Reason Eval/Treat Not Completed: Fatigue/lethargy limiting ability to participate this afternoon. The pt was encouraged to rest and PT will attempt to return as time/schedule allows.   Vickki Muff, PT, DPT   Acute Rehabilitation Department Pager #: [phone]   [name] 07/18/2021, 3:16 PM

## 2021-07-18 NOTE — Progress Notes (Signed)
Patient eats 25-50% of her meal, takes only few sips of ensure. Patient's appetite is very poor.

## 2021-07-18 NOTE — Progress Notes (Signed)
Occupational Therapy Treatment Patient Details Name: Autumn James MRN: [id number] DOB: 1956/07/27 Today's Date: 07/18/2021   History of present illness Autumn James is a 65 y.o. female presenting with fatigue and hyperglycemia. Pt admitted for further work-up of DKA vs HHS due to DMII and insulin noncompliance. PMH: DM, HTN, HLD.   OT comments  Pt demonstrated ability to don front opening gown and socks at EOB with set up. She transferred to the chair with RW and min guard assist. Set up to eat soup brought in by son, once up in chair. Pt with poor activity tolerance. She reports leading a sedentary lifestyle.    Recommendations for follow up therapy are one component of a multi-disciplinary discharge planning process, led by the attending physician.  Recommendations may be updated based on patient status, additional functional criteria and insurance authorization.    Follow Up Recommendations  No OT follow up    Equipment Recommendations  None recommended by OT    Recommendations for Other Services      Precautions / Restrictions Precautions Precautions: Fall;Other (comment) Precaution Comments: urine incontinence       Mobility Bed Mobility Overal bed mobility: Needs Assistance Bed Mobility: Supine to Sit     Supine to sit: Min assist     General bed mobility comments: min assist to raise trunk, increased time    Transfers Overall transfer level: Needs assistance Equipment used: Rolling walker (2 wheeled) Transfers: Sit to/from [employer] Sit to Stand: Min guard Stand pivot transfers: Min guard       General transfer comment: Assist for safety and lines    Balance Overall balance assessment: Needs assistance   Sitting balance-Leahy Scale: Good     Standing balance support: Bilateral upper extremity supported Standing balance-Leahy Scale: Fair Standing balance comment: fair standing statically                            ADL either performed or assessed with clinical judgement   ADL Overall ADL's : Needs assistance/impaired Eating/Feeding: Independent;Sitting               Upper Body Dressing : Set up;Sitting   Lower Body Dressing: Set up;Sitting/lateral leans Lower Body Dressing Details (indicate cue type and reason): donned socks pulling leg up on bed               General ADL Comments: educated in energy conservation and provided written handout     Vision       Perception     Praxis      Cognition Arousal/Alertness: Awake/alert Behavior During Therapy: WFL for tasks assessed/performed Overall Cognitive Status: No family/caregiver present to determine baseline cognitive functioning                                 General Comments: May be close to her baseline        Exercises     Shoulder Instructions       General Comments      Pertinent Vitals/ Pain       Pain Assessment: No/denies pain  Home Living                                          Prior Functioning/Environment  Frequency  Min 2X/week        Progress Toward Goals  OT Goals(current goals can now be found in the care plan section)  Progress towards OT goals: Progressing toward goals  Acute Rehab OT Goals Patient Stated Goal: go home to [place] on Saturday OT Goal Formulation: With patient Time For Goal Achievement: 07/30/21 Potential to Achieve Goals: Good  Plan Discharge plan remains appropriate    Co-evaluation                 AM-PAC OT "6 Clicks" Daily Activity     Outcome Measure   Help from another person eating meals?: None Help from another person taking care of personal grooming?: A Little Help from another person toileting, which includes using toliet, bedpan, or urinal?: A Little Help from another person bathing (including washing, rinsing, drying)?: A Little Help from another person to put on and taking off regular upper  body clothing?: None Help from another person to put on and taking off regular lower body clothing?: A Little 6 Click Score: 20    End of Session Equipment Utilized During Treatment: Gait belt;Rolling walker  OT Visit Diagnosis: Unsteadiness on feet (R26.81);Other abnormalities of gait and mobility (R26.89)   Activity Tolerance Patient tolerated treatment well   Patient Left in chair;with call bell/phone within reach;with chair alarm set   Nurse Communication          Time: 1425-1500 OT Time Calculation (min): 35 min  Charges: OT General Charges $OT Visit: 1 Visit OT Treatments $Self Care/Home Management : 23-37 mins  Martie Round, OTR/L Acute Rehabilitation Services Pager: [phone] Office: [phone]   Evern Bio 07/18/2021, 3:28 PM

## 2021-07-18 NOTE — Progress Notes (Addendum)
Family Medicine Teaching Service Daily Progress Note Intern Pager: [phone]  Patient name: Autumn James Medical record number: [id number] Date of birth: [birth year] Age: 65 y.o. Gender: female  Primary Care Provider: Pcp, No Consultants: none Code Status: FULL  Pt Overview and Major Events to Date:  9/26: admitted with glc of 916, endo tool, gap closed 9/27: gap back at 14, endo tool started and then D/C'd 9/28: managed with SQ insulin  Assessment and Plan: The patient is a 65 year old female presenting with hyperglycemia and fatigue.  Past medical history significant for T2DM, hypertension, and hyperlipidemia.  Hyperglycemia, DKA in the context of T2DM with insulin dependence Patient received 45 units of glargine yesterday morning and 38 units of sliding scale yesterday.  Did not receive her prandial insulin given that she did not consume greater than 50% of her meals yesterday.  CBGs have ranged from 187-371.  Fasting CBG of 240 this morning. - Given increased dose of 60 units of glargine this morning -- Discontinue 10U prandial insulin given patient is not eating full meals -- Continue resistant SSI -- Encouraged PO with patient -- Ensure Enlive BID -- Follow daytime CBGs -- Morning BMP -- D/C cardiac monitoring, patient still on, will talk with nursing  AKI on CKD (?) Cr of 1.05 in 2019. Cr of 2.4 on admission. This AM 1.22, AKI resolved at this point, likely pre-renal in etiology.  -Daily BMP -Restart home losartan at 25 mg for renal protection -Avoid nephrotoxins   Hypertension Blood pressure well controlled with Coreg.  - Continue home Coreg 12.5 mg twice daily -- Restart home losartan at 25 mg (home dose of 100 mg) - Hold home hydralazine 25 mg 3 times daily   Leukocytosis (resolved) Unknown origin. From 12.9 nml past 2 days.  -No more follow up needed   Hyperlipidemia Chronic condition -Continue home atorvastatin 40 mg daily   FEN/GI: regular diet to  encourage PO intake PPx: Lovenox 40 mg Dispo: home, no PT/OT f/u recommended   Subjective:  On interview this morning patient appear similar to previous days. She is alert and oriented, denying chest pain, SoB, and N/V. She is informed about her treatment plan.   Objective: Temp:  [98 F (36.7 C)-98.6 F (37 C)] 98.4 F (36.9 C) (09/29 0305) Pulse Rate:  [65-83] 67 (09/29 0305) Resp:  [13-18] 13 (09/29 0305) BP: (121-140)/(72-126) 136/77 (09/29 0305) SpO2:  [94 %-97 %] 94 % (09/29 0305) Physical Exam Vitals reviewed.  Cardiovascular:     Rate and Rhythm: Normal rate and regular rhythm.     Pulses: Normal pulses.     Heart sounds: No murmur heard. Pulmonary:     Effort: Pulmonary effort is normal.     Breath sounds: Normal breath sounds.  Abdominal:     General: Bowel sounds are normal.     Palpations: Abdomen is soft.     Tenderness: There is no abdominal tenderness.  Neurological:     Mental Status: She is alert.     Laboratory: Recent Labs  Lab 07/16/21 0351 07/17/21 0043 07/18/21 0044  WBC 12.9* 8.5 6.6  HGB 11.8* 12.0 11.3*  HCT 39.7 38.7 35.7*  PLT 213 168 166   Recent Labs  Lab 07/15/21 0641 07/15/21 1234 07/17/21 0043 07/17/21 1256 07/18/21 0044  NA 154*   < > 140 133* 136  K 3.9   < > 3.9 3.7 3.9  CL 113*   < > 107 103 105  CO2 28   < > 26  23 24  BUN 50*   < > 43* 36* 28*  CREATININE 2.56*   < > 1.50* 1.36* 1.22*  CALCIUM 10.7*   < > 9.2 8.4* 8.9  PROT 7.7  --   --   --   --   BILITOT 1.1  --   --   --   --   ALKPHOS 79  --   --   --   --   ALT 14  --   --   --   --   AST 12*  --   --   --   --   GLUCOSE 389*   < > 337* 391* 250*   < > = values in this interval not displayed.    Imaging/Diagnostic Tests: CBGs and BMP as above  Carlyn Reichert PGY-1, Psychiatry FPTS Intern pager: [phone], text pages welcome

## 2021-07-18 NOTE — Plan of Care (Signed)
  Problem: Education: Goal: Knowledge of General Education information will improve Description: Including pain rating scale, medication(s)/side effects and non-pharmacologic comfort measures Outcome: Progressing   Problem: Health Behavior/Discharge Planning: Goal: Ability to manage health-related needs will improve Outcome: Progressing   Problem: Clinical Measurements: Goal: Ability to maintain clinical measurements within normal limits will improve Outcome: Progressing Goal: Diagnostic test results will improve Outcome: Progressing   Problem: Nutrition: Goal: Adequate nutrition will be maintained Outcome: Progressing   

## 2021-07-19 ENCOUNTER — Other Ambulatory Visit (HOSPITAL_COMMUNITY): Payer: Self-pay

## 2021-07-19 LAB — BASIC METABOLIC PANEL
Anion gap: 9 (ref 5–15)
BUN: 24 mg/dL — ABNORMAL HIGH (ref 8–23)
CO2: 24 mmol/L (ref 22–32)
Calcium: 8.8 mg/dL — ABNORMAL LOW (ref 8.9–10.3)
Chloride: 102 mmol/L (ref 98–111)
Creatinine, Ser: 1.11 mg/dL — ABNORMAL HIGH (ref 0.44–1.00)
GFR, Estimated: 54 mL/min — ABNORMAL LOW (ref >60)
Glucose, Bld: 180 mg/dL — ABNORMAL HIGH (ref 70–99)
Potassium: 3.9 mmol/L (ref 3.5–5.1)
Sodium: 135 mmol/L (ref 135–145)

## 2021-07-19 LAB — GLUCOSE, CAPILLARY
Glucose-Capillary: 183 mg/dL — ABNORMAL HIGH (ref 70–99)
Glucose-Capillary: 248 mg/dL — ABNORMAL HIGH (ref 70–99)

## 2021-07-19 LAB — CBC
HCT: 35 % — ABNORMAL LOW (ref 36.0–46.0)
Hemoglobin: 11 g/dL — ABNORMAL LOW (ref 12.0–15.0)
MCH: 26.6 pg (ref 26.0–34.0)
MCHC: 31.4 g/dL (ref 30.0–36.0)
MCV: 84.5 fL (ref 80.0–100.0)
Platelets: 147 10*3/uL — ABNORMAL LOW (ref 150–400)
RBC: 4.14 MIL/uL (ref 3.87–5.11)
RDW: 13.7 % (ref 11.5–15.5)
WBC: 6.1 10*3/uL (ref 4.0–10.5)
nRBC: 0.5 % — ABNORMAL HIGH (ref 0.0–0.2)

## 2021-07-19 MED ORDER — INSULIN PEN NEEDLE 32G X 4 MM MISC
1.0000 | Freq: Every day | 1 refills | Status: AC
  Filled 2021-07-19: qty 100, 25d supply, fill #0

## 2021-07-19 MED ORDER — LOSARTAN POTASSIUM 25 MG PO TABS
25.0000 mg | ORAL_TABLET | Freq: Every day | ORAL | 1 refills | Status: AC
  Filled 2021-07-19: qty 30, 30d supply, fill #0

## 2021-07-19 MED ORDER — INSULIN GLARGINE-YFGN 100 UNIT/ML ~~LOC~~ SOLN: 70.0000 [IU] | Freq: Every day | SUBCUTANEOUS | Status: DC

## 2021-07-19 MED ORDER — INSULIN GLARGINE 100 UNIT/ML SOLOSTAR PEN
70.0000 [IU] | PEN_INJECTOR | Freq: Every day | SUBCUTANEOUS | 1 refills | Status: AC
  Filled 2021-07-19: qty 15, 21d supply, fill #0

## 2021-07-19 NOTE — Discharge Instructions (Addendum)
You were hospitalized here for high blood sugar. This has now been corrected. Your home insulin regimen has been increased from 30 units of glargine daily to 70 units daily. It will be important for you to take your insulin as prescribed and follow up with your PCP as soon as possible upon your return to [place]. It has been a pleasure taking care of you.   - Please inject 70 units of insulin daily - Please follow up with your doctor in [place] as soon as possible - We decreased your blood pressure medication to losartan 25 mg once a day

## 2021-07-19 NOTE — Discharge Summary (Addendum)
Family Medicine Teaching [facility] Discharge Summary  Patient name: Autumn James Medical record number: [id number] Date of birth: [birth year] Age: 65 y.o. Gender: female Date of Admission: 07/14/2021  Date of Discharge: 07/19/2021 Admitting Physician: Doreene Eland, MD  Primary Care Provider: Pcp, No Consultants: none  Indication for Hospitalization: hyperglycemia  Discharge Diagnoses/Problem List:  Hyperglycemia and DKA with T2DM, insulin dependent Acute Kidney Injury Hypertension Hyperlipidemia  Disposition: home/self care  Discharge Condition: stable, improved  Discharge Exam:  Physical Exam Vitals reviewed.  Cardiovascular:     Rate and Rhythm: Normal rate and regular rhythm.  Pulmonary:     Effort: Pulmonary effort is normal.     Breath sounds: Normal breath sounds.  Abdominal:     General: Bowel sounds are normal.     Palpations: Abdomen is soft.     Tenderness: There is no abdominal tenderness.  Neurological:     Mental Status: She is alert.   Brief Hospital Course:  Hyperglycemia in the context of T2DM, likely DKA Patient reportedly traveled from [place] to visit her son in [place]. Patient reports not taking her insulin in 4 days.  Home regimen of 30 units of Lantus daily. A1C of 14.3. On admission patient somnolent with orthostasis and polyuria.  Lab values notable for glc 916, anion gap of 26 (normal pH), beta hydroxybutyrate greater than 8, UA with 20 ketones. No signs of infection aside from mild leukocytosis to 11.2 (CXR negative, no fever). She was started on an insulin drip. Her anion gap closed and this was discontinued. Patient had limited appetite, only consuming 25-50% of meals, but was found to be very insulin resistant. Patient reports that she cannot inject herself and is not willing to learn how. We discussed this with her family with her permission and her granddaughter is in nursing school and can giver her once daily injections (she is not  available for prandial injections). Patient was found to have a daily insulin requirement of about 100U with consuming a 25-50% of a regular diet. She was discharged on 70 units of glargine daily, but may require more. Plan for discharge was discussed extensively with family. As patient is from out of state, we were unable to set up home health. We recommend she follow up with her PCP ASAP to further titrate insulin needs and to obtain home health nursing to administer more insulin.    AKI- resolved Patient is a poor historian and is unsure if she has pre-existing renal disease. Per care wheverywhere, last Cr in 2019 of 1.05. Creatinine of 2.43 in the ED with a BUN of 46.  Creatinine of 2.56 on 9/26 with BUN of 50. For this AKI, she was given mIVF and her home losartan was held. Her creatinine improved to 1.11 on the day of discharge, AKI resolved. Her home Losartan was started back at 25 mg for renal protection.   Hypertension Patient had BP to the 140's to 170's systolic and so her home Coreg was restarted. Her blood pressure improved to 120-140's systolic. She was discharged on her home dose of Coreg and the reduced dose of losartan.    HLD Continued home atorvastatin 40 mg.   Follow up recommendations: Consider goal directed medical therapies such as SGLT2i, GLP-1. Consider further increasing daily glargine with administration in two sites qday (will be necessary with doses above 70U).  Significant Procedures: none  Significant Labs and Imaging:  Recent Labs  Lab 07/17/21 0043 07/18/21 0044 07/19/21 0033  WBC 8.5 6.6  6.1  HGB 12.0 11.3* 11.0*  HCT 38.7 35.7* 35.0*  PLT 168 166 147*   Recent Labs  Lab 07/15/21 0641 07/15/21 1234 07/16/21 1940 07/17/21 0043 07/17/21 1256 07/18/21 0044 07/19/21 0033  NA 154*   < > 137 140 133* 136 135  K 3.9   < > 4.1 3.9 3.7 3.9 3.9  CL 113*   < > 105 107 103 105 102  CO2 28   < > 23 26 23 24 24   GLUCOSE 389*   < > 416* 337* 391* 250* 180*   BUN 50*   < > 45* 43* 36* 28* 24*  CREATININE 2.56*   < > 1.66* 1.50* 1.36* 1.22* 1.11*  CALCIUM 10.7*   < > 9.4 9.2 8.4* 8.9 8.8*  ALKPHOS 79  --   --   --   --   --   --   AST 12*  --   --   --   --   --   --   ALT 14  --   --   --   --   --   --   ALBUMIN 3.4*  --   --   --   --   --   --    < > = values in this interval not displayed.    Results/Tests Pending at Time of Discharge: none  Discharge Medications:  Allergies as of 07/19/2021       Reactions   Ivp Dye [iodinated Diagnostic Agents]         Medication List     STOP taking these medications    hydrALAZINE 25 MG tablet Commonly known as: APRESOLINE       TAKE these medications    atorvastatin 40 MG tablet Commonly known as: LIPITOR Take 40 mg by mouth daily.   carvedilol 12.5 MG tablet Commonly known as: COREG Take 12.5 mg by mouth 2 (two) times daily.   D3 5000 125 MCG (5000 UT) capsule Generic drug: Cholecalciferol Take 5,000 Units by mouth daily.   Lantus SoloStar 100 UNIT/ML Solostar Pen Generic drug: insulin glargine Inject 70 Units into the skin daily. What changed: how much to take   losartan 25 MG tablet Commonly known as: COZAAR Take 1 tablet (25 mg total) by mouth daily. Start taking on: July 20, 2021 What changed:  medication strength how much to take   Pentips 32G X 4 MM Misc Generic drug: Insulin Pen Needle Use as directed        Discharge Instructions: Please refer to Patient Instructions section of EMR for full details.  Patient was counseled important signs and symptoms that should prompt return to medical care, changes in medications, dietary instructions, activity restrictions, and follow up appointments.   Follow-Up Appointments: To be made by family in July 22, 2021  [place] PGY-1, Psychiatry FPTS Intern pager: [phone], text pages welcome  FPTS Upper-Level Resident Addendum   I have independently interviewed and examined the patient. I have discussed  the above with the original author and agree with their documentation. I have made additional edits as necessary. Please see also any attending notes.   [phone], M.D. PGY-3, [facility] 07/19/2021 3:10 PM  FPTS Service pager: [phone] (text pages welcome through [facility])

## 2021-07-19 NOTE — Progress Notes (Signed)
Physical Therapy Treatment Patient Details Name: Autumn James MRN: [id number] DOB: 1956/03/18 Today's Date: 07/19/2021   History of Present Illness [name] is a 65 y.o. female presenting with fatigue and hyperglycemia. Pt admitted for further work-up of DKA vs HHS due to DMII and insulin noncompliance. PMH: DM, HTN, HLD.    PT Comments    The pt was able to demo good progress with mobility this session, and was able to complete hallway ambulation with use of RW and minG for safety. The pt continues to report fatigue quickly with both self-care tasks and gait, but recovers well with seated rest. The pt was educated on importance of return to structured progressive activity to improve activity tolerance following d/c. The pt demos good safety awareness, and states her children will be available for support and assist at d/c. Will continue to benefit from skilled PT acutely to maximize activity tolerance and progress towards prior level of mobility and independence. Suspect pt is close to functional baseline, but discussed ways she could continue to safely progress her mobility and endurance.    Recommendations for follow up therapy are one component of a multi-disciplinary discharge planning process, led by the attending physician.  Recommendations may be updated based on patient status, additional functional criteria and insurance authorization.  Follow Up Recommendations  No PT follow up     Equipment Recommendations  None recommended by PT    Recommendations for Other Services       Precautions / Restrictions Precautions Precautions: Fall;Other (comment) Precaution Comments: urine incontinence Restrictions Weight Bearing Restrictions: No     Mobility  Bed Mobility Overal bed mobility: Needs Assistance Bed Mobility: Supine to Sit     Supine to sit: Supervision     General bed mobility comments: pt coming to sit EOB without assist, HOB slightly elevated     Transfers Overall transfer level: Needs assistance Equipment used: Rolling walker (2 wheeled) Transfers: Sit to/from [employer] Sit to Stand: Min guard Stand pivot transfers: Min guard       General transfer comment: minG to rise from EOB, no assist given  Ambulation/Gait Ambulation/Gait assistance: Min guard Gait Distance (Feet): 125 Feet Assistive device: Rolling walker (2 wheeled) Gait Pattern/deviations: Step-through pattern;Decreased stride length Gait velocity: 0. 23 m/s Gait velocity interpretation: <1.31 ft/sec, indicative of household ambulator General Gait Details: slight trunk flexion, but improved with cues. no overt LOB but pt reports loss of eye glasses 1 month ago and needed assist to avoid obstacles in hallway and room      Balance Overall balance assessment: Needs assistance Sitting-balance support: No upper extremity supported;Feet supported Sitting balance-Leahy Scale: Good     Standing balance support: Bilateral upper extremity supported Standing balance-Leahy Scale: Fair Standing balance comment: can static stand with onl single UE support, but requires BUE support for gait                            Cognition Arousal/Alertness: Awake/alert Behavior During Therapy: WFL for tasks assessed/performed Overall Cognitive Status: No family/caregiver present to determine baseline cognitive functioning Area of Impairment: Problem solving                             Problem Solving: Slow processing;Requires verbal cues General Comments: pt with slightly decreased insight to assist needed, cues for safety and to motivate pt. may be close to her baseline. she was able  to follow all instructions when she wanted to      Exercises      General Comments General comments (skin integrity, edema, etc.): VSS on RA      Pertinent Vitals/Pain Pain Assessment: No/denies pain     PT Goals (current goals can now be found  in the care plan section) Acute Rehab PT Goals Patient Stated Goal: go home to [place] on Saturday PT Goal Formulation: With patient Time For Goal Achievement: 07/30/21 Potential to Achieve Goals: Good Progress towards PT goals: Progressing toward goals    Frequency    Min 3X/week      PT Plan Current plan remains appropriate       AM-PAC PT "6 Clicks" Mobility   Outcome Measure  Help needed turning from your back to your side while in a flat bed without using bedrails?: A Little Help needed moving from lying on your back to sitting on the side of a flat bed without using bedrails?: A Little Help needed moving to and from a bed to a chair (including a wheelchair)?: A Little Help needed standing up from a chair using your arms (e.g., wheelchair or bedside chair)?: A Little Help needed to walk in hospital room?: A Little Help needed climbing 3-5 steps with a railing? : A Little 6 Click Score: 18    End of Session Equipment Utilized During Treatment: Gait belt Activity Tolerance: Patient tolerated treatment well Patient left: in chair;with chair alarm set;with call bell/phone within reach;with family/visitor present Nurse Communication: Mobility status PT Visit Diagnosis: Other abnormalities of gait and mobility (R26.89);Muscle weakness (generalized) (M62.81)     Time: 4008-6761 PT Time Calculation (min) (ACUTE ONLY): 40 min  Charges:  $Gait Training: 8-22 mins $Therapeutic Activity: 23-37 mins                     Vickki Muff, PT, DPT   Acute Rehabilitation Department Pager #: [phone]   [name] 07/19/2021, 11:44 AM

## 2021-07-19 NOTE — Progress Notes (Signed)
FPTS Interim Progress Note  S:No complaints this PM  O: BP 112/72 (BP Location: Left Wrist)   Pulse 68   Temp 98.2 F (36.8 C) (Oral)   Resp 17   Ht 5\' 5"  (1.651 m)   Wt 111.3 kg   SpO2 95%   BMI 40.83 kg/m    Gen: Laying in bed, responds to questions Resp: Chest rises symmetrically  A/P: Hyperglycemia in the context of T2DM likely DKA -orders and labs were reviewed and adjusted as needed  , MD 07/19/2021, 12:30 AM PGY-1, [facility] Service pager [phone]

## 2021-07-22 ENCOUNTER — Other Ambulatory Visit (HOSPITAL_COMMUNITY): Payer: Self-pay

## 2021-11-11 ENCOUNTER — Other Ambulatory Visit (HOSPITAL_COMMUNITY): Payer: Self-pay

## 2022-09-10 IMAGING — DX DG CHEST 1V PORT
1 series · 1 of 1 positions shown · non-contrast
Comparison: None.

CLINICAL DATA: Shortness of breath

EXAM:
PORTABLE CHEST 1 VIEW

[chest]
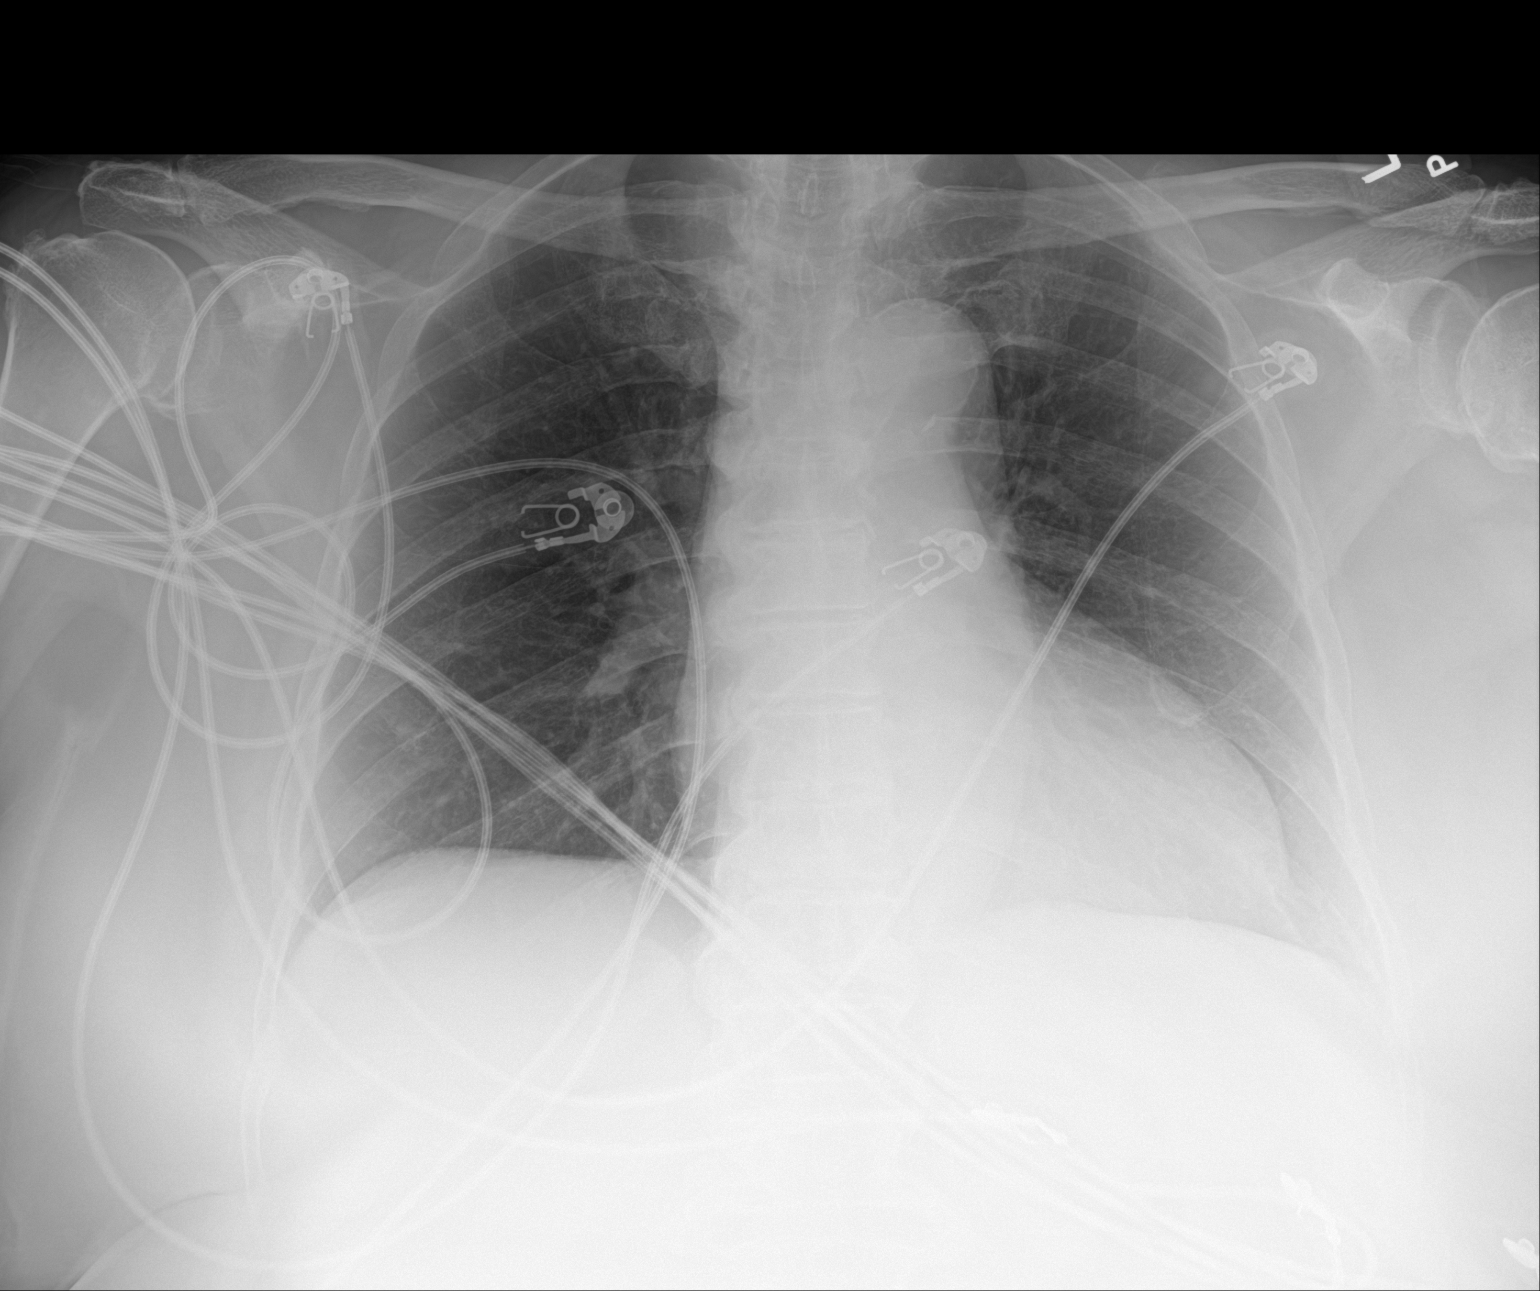

[1 of 1 positions shown; findings below may reference images not displayed]

FINDINGS: Heart and mediastinal contours are within normal limits. No focal
opacities or effusions. No acute bony abnormality.
IMPRESSION: No active disease.
